# Patient Record
Sex: Male | Born: 1975 | Race: Black or African American | Hispanic: No | Marital: Single | Smoking: Former smoker
Health system: Southern US, Community
[De-identification: ages and names within clinical notes are randomized; demographics above are authoritative.]

## PROBLEM LIST (undated history)

## (undated) DIAGNOSIS — R569 Unspecified convulsions: Secondary | ICD-10-CM

## (undated) DIAGNOSIS — I10 Essential (primary) hypertension: Secondary | ICD-10-CM

## (undated) DIAGNOSIS — F1011 Alcohol abuse, in remission: Secondary | ICD-10-CM

## (undated) HISTORY — DX: Alcohol abuse, in remission: F10.11

## (undated) HISTORY — DX: Essential (primary) hypertension: I10

---

## 2004-08-07 ENCOUNTER — Emergency Department (HOSPITAL_COMMUNITY): Admission: EM | Admit: 2004-08-07 | Discharge: 2004-08-07 | Payer: Self-pay | Admitting: Emergency Medicine

## 2007-11-05 ENCOUNTER — Emergency Department (HOSPITAL_COMMUNITY): Admission: EM | Admit: 2007-11-05 | Discharge: 2007-11-05 | Payer: Self-pay | Admitting: Emergency Medicine

## 2011-01-22 ENCOUNTER — Emergency Department (HOSPITAL_COMMUNITY): Payer: Self-pay

## 2011-01-22 ENCOUNTER — Emergency Department (HOSPITAL_COMMUNITY)
Admission: EM | Admit: 2011-01-22 | Discharge: 2011-01-23 | Disposition: A | Discharge status: Home / Self Care | Payer: Self-pay | Attending: Emergency Medicine | Admitting: Emergency Medicine

## 2011-01-22 DIAGNOSIS — Z23 Encounter for immunization: Secondary | ICD-10-CM | POA: Insufficient documentation

## 2011-01-22 DIAGNOSIS — IMO0002 Reserved for concepts with insufficient information to code with codable children: Secondary | ICD-10-CM | POA: Insufficient documentation

## 2011-01-22 DIAGNOSIS — W1789XA Other fall from one level to another, initial encounter: Secondary | ICD-10-CM | POA: Insufficient documentation

## 2011-01-22 DIAGNOSIS — S81009A Unspecified open wound, unspecified knee, initial encounter: Secondary | ICD-10-CM | POA: Insufficient documentation

## 2011-01-22 DIAGNOSIS — S81809A Unspecified open wound, unspecified lower leg, initial encounter: Secondary | ICD-10-CM | POA: Insufficient documentation

## 2011-01-22 DIAGNOSIS — M79609 Pain in unspecified limb: Secondary | ICD-10-CM | POA: Insufficient documentation

## 2014-03-18 ENCOUNTER — Emergency Department (HOSPITAL_COMMUNITY)
Admission: EM | Admit: 2014-03-18 | Discharge: 2014-03-19 | Disposition: A | Discharge status: Home / Self Care | Payer: Self-pay | Attending: Emergency Medicine | Admitting: Emergency Medicine

## 2014-03-18 ENCOUNTER — Encounter (HOSPITAL_COMMUNITY): Payer: Self-pay | Admitting: Emergency Medicine

## 2014-03-18 ENCOUNTER — Emergency Department (HOSPITAL_COMMUNITY): Payer: Self-pay

## 2014-03-18 DIAGNOSIS — R404 Transient alteration of awareness: Secondary | ICD-10-CM | POA: Insufficient documentation

## 2014-03-18 DIAGNOSIS — R55 Syncope and collapse: Secondary | ICD-10-CM | POA: Insufficient documentation

## 2014-03-18 DIAGNOSIS — E860 Dehydration: Secondary | ICD-10-CM | POA: Insufficient documentation

## 2014-03-18 LAB — URINALYSIS, DIPSTICK ONLY
Bilirubin Urine: NEGATIVE
GLUCOSE, UA: NEGATIVE mg/dL
Ketones, ur: 40 mg/dL — AB
LEUKOCYTES UA: NEGATIVE
Nitrite: NEGATIVE
PH: 5.5 (ref 5.0–8.0)
PROTEIN: 30 mg/dL — AB
SPECIFIC GRAVITY, URINE: 1.016 (ref 1.005–1.030)
Urobilinogen, UA: 1 mg/dL (ref 0.0–1.0)

## 2014-03-18 LAB — CBC
HEMATOCRIT: 39 % (ref 39.0–52.0)
HEMOGLOBIN: 13.3 g/dL (ref 13.0–17.0)
MCH: 32.5 pg (ref 26.0–34.0)
MCHC: 34.1 g/dL (ref 30.0–36.0)
MCV: 95.4 fL (ref 78.0–100.0)
Platelets: 217 10*3/uL (ref 150–400)
RBC: 4.09 MIL/uL — AB (ref 4.22–5.81)
RDW: 14 % (ref 11.5–15.5)
WBC: 5.2 10*3/uL (ref 4.0–10.5)

## 2014-03-18 LAB — BASIC METABOLIC PANEL
Anion gap: 27 — ABNORMAL HIGH (ref 5–15)
BUN: 4 mg/dL — AB (ref 6–23)
CHLORIDE: 95 meq/L — AB (ref 96–112)
CO2: 17 meq/L — AB (ref 19–32)
Calcium: 9.3 mg/dL (ref 8.4–10.5)
Creatinine, Ser: 0.82 mg/dL (ref 0.50–1.35)
GFR calc Af Amer: >90 mL/min (ref >90)
GFR calc non Af Amer: >90 mL/min (ref >90)
GLUCOSE: 89 mg/dL (ref 70–99)
POTASSIUM: 4.8 meq/L (ref 3.7–5.3)
SODIUM: 139 meq/L (ref 137–147)

## 2014-03-18 LAB — RAPID URINE DRUG SCREEN, HOSP PERFORMED
AMPHETAMINES: NOT DETECTED
Barbiturates: NOT DETECTED
Benzodiazepines: NOT DETECTED
COCAINE: NOT DETECTED
OPIATES: NOT DETECTED
TETRAHYDROCANNABINOL: POSITIVE — AB

## 2014-03-18 LAB — I-STAT TROPONIN, ED: TROPONIN I, POC: 0 ng/mL (ref 0.00–0.08)

## 2014-03-18 MED ORDER — SODIUM CHLORIDE 0.9 % IV BOLUS (SEPSIS)
1000.0000 mL | Freq: Once | INTRAVENOUS | Status: AC
  Administered 2014-03-18: 1000 mL via INTRAVENOUS

## 2014-03-18 MED ORDER — ONDANSETRON HCL 4 MG/2ML IJ SOLN
4.0000 mg | Freq: Once | INTRAMUSCULAR | Status: DC
  Filled 2014-03-18: qty 2

## 2014-03-18 NOTE — ED Notes (Signed)
Pt in via EMS after a syncopal episode at home, pt was walking and family witnessed patient fall, pt does not remember event, pt was diaphoretic upon EMS arrival with HR in 130s, pt denies pain or complaints at this time, denies ETOH or drug use, pt CBG 104, denies medical history, pt remains diaphoretic at this time, HR 120 upon arrival.

## 2014-03-18 NOTE — Discharge Instructions (Signed)

## 2014-03-18 NOTE — ED Provider Notes (Signed)
CSN: [id number]     Arrival date & time 03/18/14  2034 History   First MD Initiated Contact with Patient 03/18/14 2036     Chief Complaint  Patient presents with  . Loss of Consciousness     (Consider location/radiation/quality/duration/timing/severity/associated sxs/prior Treatment) HPI  Michael Hayden is a 38 y.o. male with noncontributory past medical history, presenting for syncope. Per EMS report, patient's family supper patient get up from a chair at home earlier today, he started walking and "passed out." Patient denies anything prior to arrival, and is only complaining of mild generalized headache, but is unsure of preceding events. Patient states that he is home all day, and the house has been "hot" as the air conditioning was on. Patient endorses normal oral intake. Denies chest pain, shortness of breath, nausea, vomiting, diarrhea, abdominal pain, or other associated symptoms. Patient endorses smoking cigarettes, and occasional EtOH, but states that he did not consume EtOH today. Denies recreational drug use.    History reviewed. No pertinent past medical history. History reviewed. No pertinent past surgical history. History reviewed. No pertinent family history. History  Substance Use Topics  . Smoking status: Unknown If Ever Smoked  . Smokeless tobacco: Not on file  . Alcohol Use: Not on file    Review of Systems  Constitutional: Positive for diaphoresis.  HENT: Negative.   Eyes: Negative.   Respiratory: Negative.   Cardiovascular: Negative.   Gastrointestinal: Negative.   Endocrine: Negative.   Genitourinary: Negative.   Musculoskeletal: Negative.   Skin: Negative.   Allergic/Immunologic: Negative.   Neurological: Positive for syncope.  Hematological: Negative.   Psychiatric/Behavioral: Negative.       Allergies  Review of patient's allergies indicates no known allergies.  Home Medications   Prior to Admission medications   Not on File   BP 136/89   Pulse 114  Temp(Src) 99.3 F (37.4 C) (Oral)  Resp 18  SpO2 99% Physical Exam  Nursing note and vitals reviewed. Constitutional: He is oriented to person, place, and time. He appears well-developed and well-nourished. No distress.  HENT:  Head: Normocephalic and atraumatic.  Right Ear: External ear normal.  Left Ear: External ear normal.  Nose: Nose normal.  Mouth/Throat: Oropharynx is clear and moist. No oropharyngeal exudate.  Eyes: Conjunctivae and EOM are normal. Pupils are equal, round, and reactive to light. Right eye exhibits no discharge. Left eye exhibits no discharge. No scleral icterus.  Neck: Normal range of motion. Neck supple. No JVD present. No tracheal deviation present. No thyromegaly present.  Cardiovascular: Regular rhythm, normal heart sounds and intact distal pulses.  Tachycardia present.  Exam reveals no gallop and no friction rub.   No murmur heard. Pulmonary/Chest: Effort normal and breath sounds normal. No stridor. No respiratory distress. He has no wheezes. He has no rales. He exhibits no tenderness.  Abdominal: Soft. Bowel sounds are normal. He exhibits no distension. There is no tenderness. There is no rebound and no guarding.  Musculoskeletal: Normal range of motion. He exhibits no edema and no tenderness.  Lymphadenopathy:    He has no cervical adenopathy.  Neurological: He is alert and oriented to person, place, and time. He is not disoriented. He displays no atrophy and no tremor. No cranial nerve deficit or sensory deficit. He exhibits normal muscle tone. He displays no seizure activity. Coordination and gait normal. GCS eye subscore is 4. GCS verbal subscore is 5. GCS motor subscore is 6.  Pt unable to remember events prior to arrival before  his syncopal episode.  Otherwise, alert and oriented.  Skin: Skin is warm and dry. No rash noted. He is not diaphoretic. No erythema. No pallor.  Psychiatric: He has a normal mood and affect. His behavior is normal.  Judgment and thought content normal.    ED Course  Procedures (including critical care time) Labs Review Labs Reviewed  CBC - Abnormal; Notable for the following:    RBC 4.09 (*)    All other components within normal limits  BASIC METABOLIC PANEL - Abnormal; Notable for the following:    Chloride 95 (*)    CO2 17 (*)    BUN 4 (*)    Anion gap 27 (*)    All other components within normal limits  URINALYSIS, DIPSTICK ONLY - Abnormal; Notable for the following:    Hgb urine dipstick TRACE (*)    Ketones, ur 40 (*)    Protein, ur 30 (*)    All other components within normal limits  URINE RAPID DRUG SCREEN (HOSP PERFORMED) - Abnormal; Notable for the following:    Tetrahydrocannabinol POSITIVE (*)    All other components within normal limits  I-STAT TROPOININ, ED    Imaging Review Dg Chest 2 View  03/18/2014   CLINICAL DATA:  Loss of consciousness.  EXAM: CHEST  2 VIEW  COMPARISON:  No priors.  FINDINGS: Lung volumes are normal. No consolidative airspace disease. No pleural effusions. No pneumothorax. No pulmonary nodule or mass noted. Pulmonary vasculature and the cardiomediastinal silhouette are within normal limits.  IMPRESSION: No radiographic evidence of acute cardiopulmonary disease.   Electronically Signed   By: Trudie Reed M.D.   On: 03/18/2014 23:34     EKG Interpretation   Date/Time:  Sunday March 18 2014 20:40:37 EDT Ventricular Rate:  117 PR Interval:  124 QRS Duration: 74 QT Interval:  326 QTC Calculation: 455 R Axis:   70 Text Interpretation:  Sinus tachycardia Ventricular premature complex  Aberrant complex Right atrial enlargement LVH with secondary  repolarization abnormality no previous Confirmed by HARRISON  MD, FORREST  ([id number]) on 03/18/2014 9:11:40 PM      MDM   Final diagnoses:  Dehydration  Syncope, unspecified syncope type    On exam patient appears warm to touch, and is tachycardic and diaphoretic. Presentation possibly consistent with  heat associated syncope, possibly dehydration, or cardiac cause. Could also consider illicit drug use. We'll give a bolus of normal saline along with Zofran. We'll check orthostatic vital signs, EKG, chest x-ray, basic labs, and urine studies.  Patient's tachycardia improved with normal saline boluses. Workup notable for ketones in urine and positive anion gap likely secondary to ketosis. Later patient endorses alcohol consumption today, unclear how much. When asked to recount dense prior to arrival, he stated "my granddad was drinkin' too."  UDS positive for THC.  Patient states he feels better following administration of fluids. No findings on workup to suggest underlying cardiopulmonary pathology.  Patient  given return precautions for dehydration.  Advised to return for worsening symptoms including chest pain, shortness of breath, severe headache, intractable nausea or vomiting, fever, or chills, inability to take medications, or other acute concerns.  Advised to follow up with wellness clinic as needed.  Patient in agreement with and expressed understanding of follow plan, plan of care, and return precautions.  All questions answered prior to discharge.  Patient was discharged in stable condition, ambulating without difficulty.  Patient care was discussed with my attending, Dr. Romeo Apple.  Gavin Pound, MD 03/19/14  0105 

## 2014-03-20 NOTE — ED Provider Notes (Signed)
Medical screening examination/treatment/procedure(s) were conducted as a shared visit with resident physician and myself.  I personally evaluated the patient during the encounter.   EKG Interpretation  Date/Time:  Sunday March 18 2014 20:40:37 EDT Ventricular Rate:  117 PR Interval:  124 QRS Duration: 74 QT Interval:  326 QTC Calculation: 455 R Axis:   70 Text Interpretation:  Sinus tachycardia Ventricular premature complex Aberrant complex Right atrial enlargement LVH with secondary repolarization abnormality no previous Confirmed by Terina Mcelhinny  MD, Lima Chillemi ([id number]) on 03/18/2014 9:11:40 PM      I interviewed and examined the patient. Lungs are CTAB. Cardiac exam wnl. Abdomen soft.  Initially tachycardic. This resolved w/ IVF. Mild HA, but doubt any intracranial pathology as cause of syncope as pt otherwise well appearing and interactive w/ no gross motor/sensory deficits. He continues to appear well. Syncope likely multifactorial relating to etoh, heat, dehydration, possibly drug use etc. Will rec return for any worsening sx.   Purvis SheffieldForrest Caily Rakers, MD 03/20/14 1041

## 2014-05-19 ENCOUNTER — Emergency Department (HOSPITAL_COMMUNITY): Payer: Self-pay

## 2014-05-19 ENCOUNTER — Encounter (HOSPITAL_COMMUNITY): Payer: Self-pay | Admitting: Emergency Medicine

## 2014-05-19 ENCOUNTER — Emergency Department (HOSPITAL_COMMUNITY)
Admission: EM | Admit: 2014-05-19 | Discharge: 2014-05-19 | Disposition: A | Discharge status: Home / Self Care | Payer: Self-pay | Attending: Emergency Medicine | Admitting: Emergency Medicine

## 2014-05-19 DIAGNOSIS — H05233 Hemorrhage of bilateral orbit: Secondary | ICD-10-CM

## 2014-05-19 DIAGNOSIS — S01112A Laceration without foreign body of left eyelid and periocular area, initial encounter: Secondary | ICD-10-CM | POA: Insufficient documentation

## 2014-05-19 DIAGNOSIS — S2020XA Contusion of thorax, unspecified, initial encounter: Secondary | ICD-10-CM | POA: Insufficient documentation

## 2014-05-19 DIAGNOSIS — S0990XA Unspecified injury of head, initial encounter: Secondary | ICD-10-CM | POA: Insufficient documentation

## 2014-05-19 DIAGNOSIS — S0512XA Contusion of eyeball and orbital tissues, left eye, initial encounter: Secondary | ICD-10-CM | POA: Insufficient documentation

## 2014-05-19 DIAGNOSIS — Z720 Tobacco use: Secondary | ICD-10-CM | POA: Insufficient documentation

## 2014-05-19 DIAGNOSIS — S0511XA Contusion of eyeball and orbital tissues, right eye, initial encounter: Secondary | ICD-10-CM | POA: Insufficient documentation

## 2014-05-19 DIAGNOSIS — S0181XA Laceration without foreign body of other part of head, initial encounter: Secondary | ICD-10-CM

## 2014-05-19 DIAGNOSIS — K029 Dental caries, unspecified: Secondary | ICD-10-CM | POA: Insufficient documentation

## 2014-05-19 DIAGNOSIS — Z23 Encounter for immunization: Secondary | ICD-10-CM | POA: Insufficient documentation

## 2014-05-19 LAB — CBC
HEMATOCRIT: 37.7 % — AB (ref 39.0–52.0)
HEMOGLOBIN: 13 g/dL (ref 13.0–17.0)
MCH: 33 pg (ref 26.0–34.0)
MCHC: 34.5 g/dL (ref 30.0–36.0)
MCV: 95.7 fL (ref 78.0–100.0)
PLATELETS: 207 10*3/uL (ref 150–400)
RBC: 3.94 MIL/uL — ABNORMAL LOW (ref 4.22–5.81)
RDW: 15.3 % (ref 11.5–15.5)
WBC: 9.4 10*3/uL (ref 4.0–10.5)

## 2014-05-19 LAB — COMPREHENSIVE METABOLIC PANEL
ALBUMIN: 3.4 g/dL — AB (ref 3.5–5.2)
ALK PHOS: 56 U/L (ref 39–117)
ALT: 41 U/L (ref 0–53)
ANION GAP: 17 — AB (ref 5–15)
AST: 108 U/L — ABNORMAL HIGH (ref 0–37)
BILIRUBIN TOTAL: 0.3 mg/dL (ref 0.3–1.2)
BUN: 5 mg/dL — AB (ref 6–23)
CO2: 25 mEq/L (ref 19–32)
CREATININE: 0.8 mg/dL (ref 0.50–1.35)
Calcium: 8.1 mg/dL — ABNORMAL LOW (ref 8.4–10.5)
Chloride: 101 mEq/L (ref 96–112)
GFR calc non Af Amer: >90 mL/min (ref >90)
GLUCOSE: 149 mg/dL — AB (ref 70–99)
POTASSIUM: 3.3 meq/L — AB (ref 3.7–5.3)
Sodium: 143 mEq/L (ref 137–147)
TOTAL PROTEIN: 6.7 g/dL (ref 6.0–8.3)

## 2014-05-19 LAB — I-STAT CHEM 8, ED
CHLORIDE: 100 meq/L (ref 96–112)
Calcium, Ion: 1.05 mmol/L — ABNORMAL LOW (ref 1.12–1.23)
Creatinine, Ser: 1.3 mg/dL (ref 0.50–1.35)
Glucose, Bld: 149 mg/dL — ABNORMAL HIGH (ref 70–99)
HCT: 42 % (ref 39.0–52.0)
Hemoglobin: 14.3 g/dL (ref 13.0–17.0)
POTASSIUM: 3.1 meq/L — AB (ref 3.7–5.3)
SODIUM: 141 meq/L (ref 137–147)
TCO2: 25 mmol/L (ref 0–100)

## 2014-05-19 LAB — I-STAT CG4 LACTIC ACID, ED
LACTIC ACID, VENOUS: 4.33 mmol/L — AB (ref 0.5–2.2)
Lactic Acid, Venous: 2.6 mmol/L — ABNORMAL HIGH (ref 0.5–2.2)

## 2014-05-19 LAB — PROTIME-INR
INR: 1.01 (ref 0.00–1.49)
Prothrombin Time: 13.3 seconds (ref 11.6–15.2)

## 2014-05-19 LAB — ETHANOL: Alcohol, Ethyl (B): 299 mg/dL — ABNORMAL HIGH (ref 0–11)

## 2014-05-19 MED ORDER — OXYCODONE-ACETAMINOPHEN 5-325 MG PO TABS: 2.0000 | ORAL_TABLET | ORAL | Status: DC | PRN

## 2014-05-19 MED ORDER — LIDOCAINE-EPINEPHRINE (PF) 2 %-1:200000 IJ SOLN
20.0000 mL | Freq: Once | INTRAMUSCULAR | Status: DC
  Filled 2014-05-19: qty 20

## 2014-05-19 MED ORDER — SODIUM CHLORIDE 0.9 % IV BOLUS (SEPSIS)
1000.0000 mL | Freq: Once | INTRAVENOUS | Status: AC
  Administered 2014-05-19: 1000 mL via INTRAVENOUS

## 2014-05-19 MED ORDER — HYDROMORPHONE HCL 1 MG/ML IJ SOLN
1.0000 mg | Freq: Once | INTRAMUSCULAR | Status: AC
  Administered 2014-05-19: 1 mg via INTRAVENOUS
  Filled 2014-05-19: qty 1

## 2014-05-19 MED ORDER — TETANUS-DIPHTH-ACELL PERTUSSIS 5-2.5-18.5 LF-MCG/0.5 IM SUSP
0.5000 mL | Freq: Once | INTRAMUSCULAR | Status: AC
  Administered 2014-05-19: 0.5 mL via INTRAMUSCULAR
  Filled 2014-05-19: qty 0.5

## 2014-05-19 NOTE — ED Notes (Signed)
Pt placed in a gown. Pt hooked up to cardiac monitor with blood pressure cuff and pulse ox in place

## 2014-05-19 NOTE — ED Notes (Signed)
PA at bedside suturing pt. 
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
## 2014-05-19 NOTE — ED Notes (Signed)
Patient transported to CT 
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
## 2014-05-19 NOTE — ED Notes (Signed)
Lactic acid results given to Dr. Pickering 

## 2014-05-19 NOTE — ED Provider Notes (Signed)
  Physical Exam  BP 129/81  Pulse 101  Temp(Src) 98.4 F (36.9 C) (Axillary)  Resp 16  SpO2 94%  Physical Exam  Nursing note and vitals reviewed. Constitutional: He is oriented to person, place, and time. He appears well-developed and well-nourished. No distress.  HENT:  Head: Normocephalic and atraumatic.    6-7 cm jagged laceration with mild hemorrhaging.  Eyes: Right eye exhibits no discharge. Left eye exhibits no discharge. No scleral icterus.  Neck: Normal range of motion.  Pulmonary/Chest: Effort normal. No respiratory distress.  Musculoskeletal: Normal range of motion.  Neurological: He is alert and oriented to person, place, and time.  Skin: Skin is warm and dry. He is not diaphoretic.  Psychiatric: He has a normal mood and affect.    ED Course  LACERATION REPAIR Date/Time: 05/19/2014 7:29 AM Performed by: Monte FantasiaMINTZ, JOSEPH W Authorized by: Monte FantasiaMINTZ, JOSEPH W Consent: Verbal consent obtained. Risks and benefits: risks, benefits and alternatives were discussed Consent given by: patient Patient identity confirmed: verbally with patient Body area: head/neck Location details: left eyelid Laceration length: 7 cm Foreign bodies: no foreign bodies Tendon involvement: none Nerve involvement: none Vascular damage: no Anesthesia: local infiltration Local anesthetic: lidocaine 2% with epinephrine Anesthetic total: 7 ml Patient sedated: no Preparation: Patient was prepped and draped in the usual sterile fashion. Irrigation solution: saline Irrigation method: syringe Amount of cleaning: standard Debridement: none Degree of undermining: none Skin closure: 5-0 Prolene Number of sutures: 14 Technique: simple Approximation: close Approximation difficulty: complex Dressing: 4x4 sterile gauze and antibiotic ointment Patient tolerance: Patient tolerated the procedure well with no immediate complications.    MDM Patient is a being seen and evaluated by Dr. Norlene Campbelltter. I was  requested to perform skin laceration to patient's superficial inferior left eyelid. Laceration is approximately 6-7 cm, with stellate flap approximately 1 to 2 cm from the medial epicanthal fold. 14 sutures placed, as noted in procedure note above.  Signed,  Ladona MowJoe Asees Manfredi, PA-C 7:37 AM       Monte FantasiaJoseph W Mahagony Grieb, PA-C 05/19/14 [phone]

## 2014-05-20 NOTE — ED Provider Notes (Signed)
Medical screening examination/treatment/procedure(s) were conducted as a shared visit with non-physician practitioner(s) and myself.  I personally evaluated the patient during the encounter.   EKG Interpretation   Date/Time:  Saturday May 19 2014 01:33:56 EDT Ventricular Rate:  90 PR Interval:  133 QRS Duration: 78 QT Interval:  389 QTC Calculation: 476 R Axis:   65 Text Interpretation:  Sinus rhythm Consider left ventricular hypertrophy  Abnrm T, consider ischemia, anterolateral lds diffuse t wave changed from  prior Confirmed by Maisha Bogen  MD, Balen Woolum ([id number]) on 05/19/2014 2:50:36 AM     Pt seen by me, please see note.  Lac repair done by PA Sula SodaMintz  Daylee Delahoz M Aniela Caniglia, MD 05/20/14 [phone]

## 2014-05-23 ENCOUNTER — Encounter (HOSPITAL_COMMUNITY): Payer: Self-pay | Admitting: Emergency Medicine

## 2014-05-23 ENCOUNTER — Emergency Department (INDEPENDENT_AMBULATORY_CARE_PROVIDER_SITE_OTHER)
Admission: EM | Admit: 2014-05-23 | Discharge: 2014-05-23 | Disposition: A | Discharge status: Home / Self Care | Payer: Self-pay | Source: Home / Self Care | Attending: Family Medicine | Admitting: Family Medicine

## 2014-05-23 DIAGNOSIS — Z4802 Encounter for removal of sutures: Secondary | ICD-10-CM
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
## 2014-05-23 NOTE — Discharge Instructions (Signed)
Suture Removal, Care After °Refer to this sheet in the next few weeks. These instructions provide you with information on caring for yourself after your procedure. Your health care provider may also give you more specific instructions. Your treatment has been planned according to current medical practices, but problems sometimes occur. Call your health care provider if you have any problems or questions after your procedure. °WHAT TO EXPECT AFTER THE PROCEDURE °After your stitches (sutures) are removed, it is typical to have the following: °· Some discomfort and swelling in the wound area. °· Slight redness in the area. °HOME CARE INSTRUCTIONS  °· If you have skin adhesive strips over the wound area, do not take the strips off. They will fall off on their own in a few days. If the strips remain in place after 14 days, you may remove them. °· Change any bandages (dressings) at least once a day or as directed by your health care provider. If the bandage sticks, soak it off with warm, soapy water. °· Apply cream or ointment only as directed by your health care provider. If using cream or ointment, wash the area with soap and water 2 times a day to remove all the cream or ointment. Rinse off the soap and pat the area dry with a clean towel. °· Keep the wound area dry and clean. If the bandage becomes wet or dirty, or if it develops a bad smell, change it as soon as possible. °· Continue to protect the wound from injury. °· Use sunscreen when out in the sun. New scars become sunburned easily. °SEEK MEDICAL CARE IF: °· You have increasing redness, swelling, or pain in the wound. °· You see pus coming from the wound. °· You have a fever. °· You notice a bad smell coming from the wound or dressing. °· Your wound breaks open (edges not staying together). °Document Released: 04/21/2001 Document Revised: 05/17/2013 Document Reviewed: 03/08/2013 °ExitCare® Patient Information ©2015 [employer]. This information is not  intended to replace advice given to you by your health care provider. Make sure you discuss any questions you have with your health care provider. ° °Scar Minimization °You will have a scar anytime you have surgery and a cut is made in the skin or you have something removed from your skin (mole, skin cancer, cyst). Although scars are unavoidable following surgery, there are ways to minimize their appearance. °It is important to follow all the instructions you receive from your caregiver about wound care. How your wound heals will influence the appearance of your scar. If you do not follow the wound care instructions as directed, complications such as infection may occur. Wound instructions include keeping the wound clean, moist, and not letting the wound form a scab. Some people form scars that are raised and lumpy (hypertrophic) or larger than the initial wound (keloidal). °HOME CARE INSTRUCTIONS  °· Follow wound care instructions as directed. °· Keep the wound clean by washing it with soap and water. °· Keep the wound moist with provided antibiotic cream or petroleum jelly until completely healed. Moisten twice a day for about 2 weeks. °· Get stitches (sutures) taken out at the scheduled time. °· Avoid touching or manipulating your wound unless needed. Wash your hands thoroughly before and after touching your wound. °· Follow all restrictions such as limits on exercise or work. This depends on where your scar is located. °· Keep the scar protected from sunburn. Cover the scar with sunscreen/sunblock with SPF 30 or higher. °·   Gently massage the scar using a circular motion to help minimize the appearance of the scar. Do this only after the wound has closed and all the sutures have been removed. °· For hypertrophic or keloidal scars, there are several ways to treat and minimize their appearance. Methods include compression therapy, intralesional corticosteroids, laser therapy, or surgery. These methods are performed  by your caregiver. °Remember that the scar may appear lighter or darker than your normal skin color. This difference in color should even out with time. °SEEK MEDICAL CARE IF:  °· You have a fever. °· You develop signs of infection such as pain, redness, pus, and warmth. °· You have questions or concerns. °Document Released: 01/14/2010 Document Revised: 10/19/2011 Document Reviewed: 01/14/2010 °ExitCare® Patient Information ©2015 [employer]. This information is not intended to replace advice given to you by your health care provider. Make sure you discuss any questions you have with your health care provider. ° °

## 2014-05-25 NOTE — ED Provider Notes (Signed)
Medical screening examination/treatment/procedure(s) were performed by non-physician practitioner and as supervising physician I was immediately available for consultation/collaboration.  Indiya Izquierdo, M.D.  Onedia Vargus C Maki Sweetser, MD 05/25/14 1757 

## 2015-03-11 ENCOUNTER — Encounter (HOSPITAL_COMMUNITY): Payer: Self-pay | Admitting: Nurse Practitioner

## 2015-03-11 ENCOUNTER — Emergency Department (HOSPITAL_COMMUNITY): Payer: Self-pay

## 2015-03-11 ENCOUNTER — Emergency Department (HOSPITAL_COMMUNITY)
Admission: EM | Admit: 2015-03-11 | Discharge: 2015-03-11 | Disposition: A | Discharge status: Home / Self Care | Payer: Self-pay | Attending: Emergency Medicine | Admitting: Emergency Medicine

## 2015-03-11 DIAGNOSIS — Z720 Tobacco use: Secondary | ICD-10-CM | POA: Insufficient documentation

## 2015-03-11 DIAGNOSIS — R569 Unspecified convulsions: Secondary | ICD-10-CM | POA: Insufficient documentation

## 2015-03-11 LAB — BASIC METABOLIC PANEL
Anion gap: 13 (ref 5–15)
CHLORIDE: 103 mmol/L (ref 101–111)
CO2: 21 mmol/L — AB (ref 22–32)
CREATININE: 1.03 mg/dL (ref 0.61–1.24)
Calcium: 9.8 mg/dL (ref 8.9–10.3)
GFR calc Af Amer: >60 mL/min (ref >60)
GFR calc non Af Amer: >60 mL/min (ref >60)
Glucose, Bld: 111 mg/dL — ABNORMAL HIGH (ref 65–99)
POTASSIUM: 4.7 mmol/L (ref 3.5–5.1)
Sodium: 137 mmol/L (ref 135–145)

## 2015-03-11 LAB — URINE MICROSCOPIC-ADD ON

## 2015-03-11 LAB — CBG MONITORING, ED: Glucose-Capillary: 107 mg/dL — ABNORMAL HIGH (ref 65–99)

## 2015-03-11 LAB — CBC
HCT: 41.7 % (ref 39.0–52.0)
HEMOGLOBIN: 14.3 g/dL (ref 13.0–17.0)
MCH: 32 pg (ref 26.0–34.0)
MCHC: 34.3 g/dL (ref 30.0–36.0)
MCV: 93.3 fL (ref 78.0–100.0)
PLATELETS: 250 10*3/uL (ref 150–400)
RBC: 4.47 MIL/uL (ref 4.22–5.81)
RDW: 13.8 % (ref 11.5–15.5)
WBC: 4.5 10*3/uL (ref 4.0–10.5)

## 2015-03-11 LAB — URINALYSIS, ROUTINE W REFLEX MICROSCOPIC
Bilirubin Urine: NEGATIVE
Glucose, UA: NEGATIVE mg/dL
Ketones, ur: 15 mg/dL — AB
LEUKOCYTES UA: NEGATIVE
Nitrite: NEGATIVE
PROTEIN: 30 mg/dL — AB
SPECIFIC GRAVITY, URINE: 1.02 (ref 1.005–1.030)
Urobilinogen, UA: 0.2 mg/dL (ref 0.0–1.0)
pH: 5.5 (ref 5.0–8.0)

## 2015-03-11 LAB — RAPID URINE DRUG SCREEN, HOSP PERFORMED
Amphetamines: NOT DETECTED
BENZODIAZEPINES: NOT DETECTED
Barbiturates: NOT DETECTED
Cocaine: NOT DETECTED
OPIATES: NOT DETECTED
TETRAHYDROCANNABINOL: NOT DETECTED

## 2015-03-11 LAB — ETHANOL: Alcohol, Ethyl (B): <5 mg/dL (ref <5)

## 2015-03-11 NOTE — ED Notes (Signed)
Family states they were sitting on the couch watching tv when the pt began to yell and shake all over uncontrollably. The pt does not remember the event, but states that he thought he was just taking a nap at the time. He is A&Ox4, no injuries or incontinence noted, denies hx seizures. He does c/o mild headache now.

## 2015-03-11 NOTE — ED Provider Notes (Signed)
I received this patient in sign out from Dr. Judd Lien. 39 year old male with history of alcohol abuse presented with first-time seizure earlier today. Workup reassuring and head CT negative for acute process to explain the patient's symptoms. On reexamination, the patient remains well-appearing with normal vital signs and no shakiness or other evidence of alcohol withdrawal. Per neurology recommendations, the patient has been provided with information for neurology follow-up to have outpatient EEG. Reviewed follow-up plan and return precautions with the patient and his family member. They voiced understanding. All questions answered. Patient discharged in satisfactory condition.  Results for orders placed or performed during the hospital encounter of 03/11/15  Basic metabolic panel - if new onset seizures  Result Value Ref Range   Sodium 137 135 - 145 mmol/L   Potassium 4.7 3.5 - 5.1 mmol/L   Chloride 103 101 - 111 mmol/L   CO2 21 (L) 22 - 32 mmol/L   Glucose, Bld 111 (H) 65 - 99 mg/dL   BUN <5 (L) 6 - 20 mg/dL   Creatinine, Ser 1.19 0.61 - 1.24 mg/dL   Calcium 9.8 8.9 - 14.7 mg/dL   GFR calc non Af Amer >60 >60 mL/min   GFR calc Af Amer >60 >60 mL/min   Anion gap 13 5 - 15  CBC - if new onset seizures  Result Value Ref Range   WBC 4.5 4.0 - 10.5 K/uL   RBC 4.47 4.22 - 5.81 MIL/uL   Hemoglobin 14.3 13.0 - 17.0 g/dL   HCT 82.9 56.2 - 13.0 %   MCV 93.3 78.0 - 100.0 fL   MCH 32.0 26.0 - 34.0 pg   MCHC 34.3 30.0 - 36.0 g/dL   RDW 86.5 78.4 - 69.6 %   Platelets 250 150 - 400 K/uL  Urinalysis, Routine w reflex microscopic (not at [facility])  Result Value Ref Range   Color, Urine YELLOW YELLOW   APPearance TURBID (A) CLEAR   Specific Gravity, Urine 1.020 1.005 - 1.030   pH 5.5 5.0 - 8.0   Glucose, UA NEGATIVE NEGATIVE mg/dL   Hgb urine dipstick TRACE (A) NEGATIVE   Bilirubin Urine NEGATIVE NEGATIVE   Ketones, ur 15 (A) NEGATIVE mg/dL   Protein, ur 30 (A) NEGATIVE mg/dL   Urobilinogen, UA 0.2  0.0 - 1.0 mg/dL   Nitrite NEGATIVE NEGATIVE   Leukocytes, UA NEGATIVE NEGATIVE  Ethanol  Result Value Ref Range   Alcohol, Ethyl (B) <5 <5 mg/dL  Urine microscopic-add on  Result Value Ref Range   RBC / HPF 0-2 <3 RBC/hpf   Bacteria, UA FEW (A) RARE  CBG monitoring, ED  Result Value Ref Range   Glucose-Capillary 107 (H) 65 - 99 mg/dL   Ct Head Wo Contrast  03/11/2015   CLINICAL DATA:  Altered mental status, mild headache  EXAM: CT HEAD WITHOUT CONTRAST  TECHNIQUE: Contiguous axial images were obtained from the base of the skull through the vertex without intravenous contrast.  COMPARISON:  05/19/2014  FINDINGS: Mild diffuse atrophy somewhat disproportionate for patient age. No focal abnormality to suggest mass or infarct. No hemorrhage or extra-axial fluid. No hydrocephalus. Calvarium intact. Mild chronic appearing inflammatory change in the ethmoid air cells bilaterally.  IMPRESSION: Atrophy, mild but nonetheless somewhat advanced for patient age. No acute findings.   Electronically Signed   By: Esperanza Heir M.D.   On: 03/11/2015 16:51     Laurence Spates, MD 03/11/15 [phone]

## 2015-03-11 NOTE — ED Provider Notes (Signed)
CSN: [id number]     Arrival date & time 03/11/15  1318 History   First MD Initiated Contact with Patient 03/11/15 1401     Chief Complaint  Patient presents with  . Shaking     (Consider location/radiation/quality/duration/timing/severity/associated sxs/prior Treatment) HPI Comments: Patient is a 39 year old male with no prior past medical history. He presents today for evaluation of possible seizure activity. He was sitting on the sofa earlier today when his mother reports he yelled, then broke out into a grand mal seizure. This lasted for approximately 5 minutes was brought here to be evaluated. He was initially confused afterward, however is now back to his baseline. He reported a headache after the episode, however this has also since resolved. He has no prior history of seizures. He does admit to drinking alcohol, up to 2 beers daily and has done so for many years.  Patient is a 39 y.o. male presenting with seizures. The history is provided by the patient.  Seizures Seizure activity on arrival: no   Seizure type:  Grand mal Initial focality:  Diffuse Postictal symptoms: confusion   Return to baseline: yes   Severity:  Moderate Duration:  5 minutes Timing:  Once Progression:  Resolved   History reviewed. No pertinent past medical history. History reviewed. No pertinent past surgical history. History reviewed. No pertinent family history. History  Substance Use Topics  . Smoking status: Current Every Day Smoker -- 0.25 packs/day  . Smokeless tobacco: Not on file  . Alcohol Use: 1.8 oz/week    3 Cans of beer per week    Review of Systems  Neurological: Positive for seizures.  All other systems reviewed and are negative.     Allergies  Review of patient's allergies indicates no known allergies.  Home Medications   Prior to Admission medications   Medication Sig Start Date End Date Taking? Authorizing Provider  oxyCODONE-acetaminophen (PERCOCET/ROXICET) 5-325 MG per  tablet Take 2 tablets by mouth every 4 (four) hours as needed for severe pain. 05/19/14   Marisa Severin, MD   BP 123/90 mmHg  Pulse 107  Temp(Src) 98.5 F (36.9 C) (Oral)  Resp 16  Ht 5\' 8"  (1.727 m)  Wt 150 lb (68.04 kg)  BMI 22.81 kg/m2  SpO2 97% Physical Exam  Constitutional: He is oriented to person, place, and time. He appears well-developed and well-nourished. No distress.  HENT:  Head: Normocephalic and atraumatic.  Mouth/Throat: Oropharynx is clear and moist.  Eyes: EOM are normal. Pupils are equal, round, and reactive to light.  Neck: Normal range of motion. Neck supple.  Cardiovascular: Normal rate, regular rhythm and normal heart sounds.   Pulmonary/Chest: Effort normal and breath sounds normal. No respiratory distress. He has no wheezes.  Abdominal: Soft. Bowel sounds are normal. He exhibits no distension. There is no tenderness.  Musculoskeletal: Normal range of motion. He exhibits no edema.  Lymphadenopathy:    He has no cervical adenopathy.  Neurological: He is alert and oriented to person, place, and time. No cranial nerve deficit. He exhibits normal muscle tone. Coordination normal.  Skin: Skin is warm and dry. He is not diaphoretic.  Nursing note and vitals reviewed.   ED Course  Procedures (including critical care time) Labs Review Labs Reviewed  BASIC METABOLIC PANEL - Abnormal; Notable for the following:    CO2 21 (*)    Glucose, Bld 111 (*)    BUN <5 (*)    All other components within normal limits  CBG MONITORING, ED -  Abnormal; Notable for the following:    Glucose-Capillary 107 (*)    All other components within normal limits  CBC  URINALYSIS, ROUTINE W REFLEX MICROSCOPIC (NOT AT [facility])  URINE RAPID DRUG SCREEN, HOSP PERFORMED    Imaging Review No results found.   EKG Interpretation None      MDM   Final diagnoses:  None    Patient presents after an apparent seizure area his laboratory studies are unremarkable and will now undergo a CT  scan of the head to rule out acute intracranial pathology. This is pending. Care will be signed out to Dr. Clarene Duke at shift change she will determine the final disposition based on the results of the head CT.  I've discussed the case with neurology who is recommending outpatient follow-up for EEG.  I also feel as though the patient likely drinks more alcohol than he admits to and this may also be the cause of this apparent seizure.    Geoffery Lyons, MD 03/14/15 [phone]

## 2015-03-11 NOTE — Discharge Instructions (Signed)
Follow-up with [facility] neurology. The contact information has been provided in this discharge summary for you to arrange an appointment in the next few days.  Strong caution against swimming or driving until cleared by neurology.   Seizure, Adult A seizure is abnormal electrical activity in the brain. Seizures usually last from 30 seconds to 2 minutes. There are various types of seizures. Before a seizure, you may have a warning sensation (aura) that a seizure is about to occur. An aura may include the following symptoms:   Fear or anxiety.  Nausea.  Feeling like the room is spinning (vertigo).  Vision changes, such as seeing flashing lights or spots. Common symptoms during a seizure include:  A change in attention or behavior (altered mental status).  Convulsions with rhythmic jerking movements.  Drooling.  Rapid eye movements.  Grunting.  Loss of bladder and bowel control.  Bitter taste in the mouth.  Tongue biting. After a seizure, you may feel confused and sleepy. You may also have an injury resulting from convulsions during the seizure. HOME CARE INSTRUCTIONS   If you are given medicines, take them exactly as prescribed by your health care provider.  Keep all follow-up appointments as directed by your health care provider.  Do not swim or drive or engage in risky activity during which a seizure could cause further injury to you or others until your health care provider says it is OK.  Get adequate rest.  Teach friends and family what to do if you have a seizure. They should:  Lay you on the ground to prevent a fall.  Put a cushion under your head.  Loosen any tight clothing around your neck.  Turn you on your side. If vomiting occurs, this helps keep your airway clear.  Stay with you until you recover.  Know whether or not you need emergency care. SEEK IMMEDIATE MEDICAL CARE IF:  The seizure lasts longer than 5 minutes.  The seizure is severe or you  do not wake up immediately after the seizure.  You have an altered mental status after the seizure.  You are having more frequent or worsening seizures. Someone should drive you to the emergency department or call local emergency services (911 in [place] MAKE SURE YOU:  Understand these instructions.  Will watch your condition.  Will get help right away if you are not doing well or get worse. Document Released: 07/24/2000 Document Revised: 05/17/2013 Document Reviewed: 03/08/2013 [facility] Patient Information 2015 [place], [place]. This information is not intended to replace advice given to you by your health care provider. Make sure you discuss any questions you have with your health care provider.

## 2015-03-11 NOTE — ED Notes (Signed)
Patient transported to CT 

## 2016-12-25 DIAGNOSIS — F1010 Alcohol abuse, uncomplicated: Secondary | ICD-10-CM | POA: Insufficient documentation

## 2016-12-25 DIAGNOSIS — R569 Unspecified convulsions: Secondary | ICD-10-CM | POA: Insufficient documentation

## 2017-01-27 ENCOUNTER — Ambulatory Visit (INDEPENDENT_AMBULATORY_CARE_PROVIDER_SITE_OTHER): Payer: Self-pay | Admitting: Physician Assistant

## 2017-01-27 ENCOUNTER — Encounter (INDEPENDENT_AMBULATORY_CARE_PROVIDER_SITE_OTHER): Payer: Self-pay | Admitting: Physician Assistant

## 2017-01-27 VITALS — BP 128/87 | HR 81 | Temp 97.9°F | Ht 69.5 in | Wt 140.4 lb

## 2017-01-27 DIAGNOSIS — M67432 Ganglion, left wrist: Secondary | ICD-10-CM | POA: Insufficient documentation

## 2017-01-27 DIAGNOSIS — Z8669 Personal history of other diseases of the nervous system and sense organs: Secondary | ICD-10-CM | POA: Insufficient documentation

## 2017-01-27 MED ORDER — LEVETIRACETAM 500 MG PO TABS: 500.0000 mg | ORAL_TABLET | Freq: Two times a day (BID) | ORAL | 3 refills | Status: DC

## 2017-01-27 MED FILL — ?LEVETIRACETAM 500 MG TABLE: 500 | 15 days supply | Qty: 30 | Fill #0

## 2017-01-27 NOTE — Progress Notes (Signed)
  Subjective:  Patient ID: Michael Hayden, male    DOB: 1976/03/02  Age: 41 y.o. MRN: [id number]  CC:   HPI Michael LimboWillie M Hayden is a 41 y.o. male with a recent history of seizures presents to establish care for seizures. Was at [facility] ED on 12/25/16 for a seizure. Reportedly had a CT scan done which was uremarkable. Had two seizures since his emergency room visit on 12/25/16. Has not been to neurology due to financial constraints. Is not taking any anti-epileptics. Has no other complaints or symptoms. Continues to drink beer regularly but states he has cut down. Reportedly drinking two beers daily or less.      ROS Review of Systems  Constitutional: Negative for chills, fever and malaise/fatigue.  Eyes: Negative for blurred vision.  Respiratory: Negative for shortness of breath.   Cardiovascular: Negative for chest pain and palpitations.  Gastrointestinal: Negative for abdominal pain and nausea.  Genitourinary: Negative for dysuria and hematuria.  Musculoskeletal: Negative for joint pain and myalgias.       Lump on left wrist  Skin: Negative for rash.  Neurological: Negative for tingling and headaches.  Psychiatric/Behavioral: Negative for depression. The patient is not nervous/anxious.     Objective:  BP 128/87 (BP Location: Right Arm, Patient Position: Sitting, Cuff Size: Normal)   Pulse 81   Temp 97.9 F (36.6 C) (Oral)   Ht 5' 9.5" (1.765 m)   Wt 140 lb 6.4 oz (63.7 kg)   SpO2 98%   BMI 20.44 kg/m   BP/Weight 01/27/2017 03/11/2015 05/23/2014  Systolic BP 128 125 150  Diastolic BP 87 70 101  Wt. (Lbs) 140.4 150 150  BMI 20.44 22.81 22.81      Physical Exam  Constitutional: He is oriented to person, place, and time.  Thin, NAD  HENT:  Head: Normocephalic and atraumatic.  Eyes: No scleral icterus.  Neck: Normal range of motion. Neck supple. No thyromegaly present.  Cardiovascular: Normal rate, regular rhythm and normal heart sounds.   No carotid bruits   Pulmonary/Chest: Effort normal and breath sounds normal.  Musculoskeletal: He exhibits no edema.  Neurological: He is alert and oriented to person, place, and time. No cranial nerve deficit.  Skin: Skin is warm and dry. No rash noted. No erythema. No pallor.  Approximately 1.5 cm subdermal nodule that is freely movable and rubbery to the touch located on the dorsum of the left wrist.  Psychiatric: He has a normal mood and affect. His behavior is normal. Thought content normal.  Vitals reviewed.    Assessment & Plan:   1. Hx of tonic-clonic seizures - Begin Keppra 500mg  BID - Patient wants to wait on [employer] [facility] and [facility] before being referred to neurology.    2. Ganglion cyst on dorsum of left wrist - Would need referral to surgery but patient will wait until he has acquired [employer] card and [facility].    Follow-up: 4 month for full physical  Loletta Specteroger David Vincenza Dail PA

## 2017-01-27 NOTE — Patient Instructions (Signed)
Epilepsy °Epilepsy is a condition in which a person has repeated seizures over time. A seizure is a sudden burst of abnormal electrical and chemical activity in the brain. Seizures can cause a change in attention, behavior, or the ability to remain awake and alert (altered mental status). °Epilepsy increases a person's risk of falls, accidents, and injury. It can also lead to complications, including: °· Depression. °· Poor memory. °· Sudden unexplained death in epilepsy (SUDEP). This complication is rare, and its cause is not known. ° °Most people with epilepsy lead normal lives. °What are the causes? °This condition may be caused by: °· A head injury. °· An injury that happens at birth. °· A high fever during childhood. °· A stroke. °· Bleeding that goes into or around the brain. °· Certain medicines and drugs. °· Having too little oxygen for a long period of time. °· Abnormal brain development. °· Certain infections, such as meningitis and encephalitis. °· Brain tumors. °· Conditions that are passed along from parent to child (are hereditary). ° °What are the signs or symptoms? °Symptoms of a seizure vary greatly from person to person. They include: °· Convulsions. °· Stiffening of the body. °· Involuntary movements of the arms or legs. °· Loss of consciousness. °· Breathing problems. °· Falling suddenly. °· Confusion. °· Head nodding. °· Eye blinking or fluttering. °· Lip smacking. °· Drooling. °· Rapid eye movements. °· Grunting. °· Loss of bladder control and bowel control. °· Staring. °· Unresponsiveness. ° °Some people have symptoms right before a seizure happens (aura) and right after a seizure happens. Symptoms of an aura include: °· Fear or anxiety. °· Nausea. °· Feeling like the room is spinning (vertigo). °· A feeling of having seen or heard something before (deja vu). °· Odd tastes or smells. °· Changes in vision, such as seeing flashing lights or spots. ° °Symptoms that follow a seizure  include: °· Confusion. °· Sleepiness. °· Headache. ° °How is this diagnosed? °This condition is diagnosed based on: °· Your symptoms. °· Your medical history. °· A physical exam. °· A neurological exam. A neurological exam is similar to a physical exam. It involves checking your strength, reflexes, coordination, and sensations. °· Tests, such as: °? An electroencephalogram (EEG). This is a painless test that creates a diagram of your brain waves. °? An MRI of the brain. °? A CT scan of the brain. °? A lumbar puncture, also called a spinal tap. °? Blood tests to check for signs of infection or abnormal blood chemistry. ° °How is this treated? °There is no cure for this condition, but treatment can help control seizures. Treatment may involve: °· Taking medicines to control seizures. These include medicines to prevent seizures and medicines to stop seizures as they occur. °· Having a device called a vagus nerve stimulator implanted in the chest. The device sends electrical impulses to the vagus nerve and to the brain to prevent seizures. This treatment may be recommended if medicines do not help. °· Brain surgery. There are several kinds of surgeries that may be done to stop seizures from happening or to reduce how often seizures happen. °· Having regular blood tests. You may need to have blood tests regularly to check that you are getting the right amount of medicine. ° °Once this condition has been diagnosed, it is important to begin treatment as soon as possible. For some people, epilepsy eventually goes away. °Follow these instructions at home: °Medicines ° °· Take over-the-counter and prescription medicines only as   told by your health care provider. °· Avoid any substances that may prevent your medicine from working properly, such as alcohol. °Activity °· Get enough rest. Lack of sleep can make seizures more likely to occur. °· Follow instructions from your health care provider about driving, swimming, and doing  any other activities that would be dangerous if you had a seizure. °Educating others °Teach friends and family what to do if you have a seizure. They should: °· Lay you on the ground to prevent a fall. °· Cushion your head and body. °· Loosen any tight clothing around your neck. °· Turn you on your side. If vomiting occurs, this helps keep your airway clear. °· Stay with you until you recover. °· Not hold you down. Holding you down will not stop the seizure. °· Not put anything in your mouth. °· Know whether or not you need emergency care. ° °General instructions °· Avoid anything that has ever triggered a seizure for you. °· Keep a seizure diary. Record what you remember about each seizure, especially anything that might have triggered the seizure. °· Keep all follow-up visits as told by your health care provider. This is important. °Contact a health care provider if: °· Your seizure pattern changes. °· You have symptoms of infection or another illness. This might increase your risk of having a seizure. °Get help right away if: °· You have a seizure that does not stop after 5 minutes. °· You have several seizures in a row without a complete recovery in between seizures. °· You have a seizure that makes it harder to breathe. °· You have a seizure that is different from previous seizures. °· You have a seizure that leaves you unable to speak or use a part of your body. °· You did not wake up immediately after a seizure. °This information is not intended to replace advice given to you by your health care provider. Make sure you discuss any questions you have with your health care provider. °Document Released: 07/27/2005 Document Revised: 02/22/2016 Document Reviewed: 02/04/2016 °Elsevier Interactive Patient Education © 2018 Elsevier Inc. ° °

## 2017-01-29 LAB — COMPREHENSIVE METABOLIC PANEL
ALBUMIN: 4.4 g/dL (ref 3.5–5.5)
ALK PHOS: 74 IU/L (ref 39–117)
ALT: 85 IU/L — ABNORMAL HIGH (ref 0–44)
AST: 194 IU/L — ABNORMAL HIGH (ref 0–40)
Albumin/Globulin Ratio: 1.6 (ref 1.2–2.2)
BUN / CREAT RATIO: 7 — AB (ref 9–20)
BUN: 6 mg/dL (ref 6–20)
Bilirubin Total: 0.5 mg/dL (ref 0.0–1.2)
CHLORIDE: 98 mmol/L (ref 96–106)
CO2: 23 mmol/L (ref 20–29)
Calcium: 9.2 mg/dL (ref 8.7–10.2)
Creatinine, Ser: 0.86 mg/dL (ref 0.76–1.27)
GFR calc Af Amer: 126 mL/min/{1.73_m2} (ref >59)
GFR calc non Af Amer: 109 mL/min/{1.73_m2} (ref >59)
GLUCOSE: 99 mg/dL (ref 65–99)
Globulin, Total: 2.7 g/dL (ref 1.5–4.5)
POTASSIUM: 4.2 mmol/L (ref 3.5–5.2)
SODIUM: 136 mmol/L (ref 134–144)
Total Protein: 7.1 g/dL (ref 6.0–8.5)

## 2017-01-29 LAB — CBC WITH DIFFERENTIAL/PLATELET
BASOS ABS: 0 10*3/uL (ref 0.0–0.2)
Basos: 1 %
EOS (ABSOLUTE): 0.1 10*3/uL (ref 0.0–0.4)
Eos: 3 %
Hematocrit: 40 % (ref 37.5–51.0)
Hemoglobin: 12.9 g/dL — ABNORMAL LOW (ref 13.0–17.7)
Immature Grans (Abs): 0 10*3/uL (ref 0.0–0.1)
Immature Granulocytes: 0 %
LYMPHS ABS: 1.1 10*3/uL (ref 0.7–3.1)
Lymphs: 22 %
MCH: 31.5 pg (ref 26.6–33.0)
MCHC: 32.3 g/dL (ref 31.5–35.7)
MCV: 98 fL — ABNORMAL HIGH (ref 79–97)
MONOS ABS: 0.6 10*3/uL (ref 0.1–0.9)
Monocytes: 13 %
Neutrophils Absolute: 3.1 10*3/uL (ref 1.4–7.0)
Neutrophils: 61 %
Platelets: 216 10*3/uL (ref 150–379)
RBC: 4.1 x10E6/uL — ABNORMAL LOW (ref 4.14–5.80)
RDW: 14.4 % (ref 12.3–15.4)
WBC: 5 10*3/uL (ref 3.4–10.8)

## 2017-01-29 LAB — TSH: TSH: 1.51 u[IU]/mL (ref 0.450–4.500)

## 2017-02-05 ENCOUNTER — Encounter (INDEPENDENT_AMBULATORY_CARE_PROVIDER_SITE_OTHER): Payer: Self-pay | Admitting: Physician Assistant

## 2017-02-05 ENCOUNTER — Ambulatory Visit (INDEPENDENT_AMBULATORY_CARE_PROVIDER_SITE_OTHER): Payer: Self-pay | Admitting: Physician Assistant

## 2017-02-05 VITALS — BP 105/73 | HR 93 | Temp 97.6°F | Wt 145.4 lb

## 2017-02-05 DIAGNOSIS — Z4802 Encounter for removal of sutures: Secondary | ICD-10-CM

## 2017-02-05 NOTE — Patient Instructions (Signed)
Suture Removal, Care After Refer to this sheet in the next few weeks. These instructions provide you with information on caring for yourself after your procedure. Your health care provider may also give you more specific instructions. Your treatment has been planned according to current medical practices, but problems sometimes occur. Call your health care provider if you have any problems or questions after your procedure. What can I expect after the procedure? After your stitches (sutures) are removed, it is typical to have the following:  Some discomfort and swelling in the wound area.  Slight redness in the area.  Follow these instructions at home:  If you have skin adhesive strips over the wound area, do not take the strips off. They will fall off on their own in a few days. If the strips remain in place after 14 days, you may remove them.  Change any bandages (dressings) at least once a day or as directed by your health care provider. If the bandage sticks, soak it off with warm, soapy water.  Apply cream or ointment only as directed by your health care provider. If using cream or ointment, wash the area with soap and water 2 times a day to remove all the cream or ointment. Rinse off the soap and pat the area dry with a clean towel.  Keep the wound area dry and clean. If the bandage becomes wet or dirty, or if it develops a bad smell, change it as soon as possible.  Continue to protect the wound from injury.  Use sunscreen when out in the sun. New scars become sunburned easily. Contact a health care provider if:  You have increasing redness, swelling, or pain in the wound.  You see pus coming from the wound.  You have a fever.  You notice a bad smell coming from the wound or dressing.  Your wound breaks open (edges not staying together). This information is not intended to replace advice given to you by your health care provider. Make sure you discuss any questions you have  with your health care provider. Document Released: 04/21/2001 Document Revised: 01/02/2016 Document Reviewed: 03/08/2013 Elsevier Interactive Patient Education  2017 Elsevier Inc.  

## 2017-02-05 NOTE — Progress Notes (Signed)
  Subjective:  Patient ID: Michael Hayden, male    DOB: [birth year]  Age: 41 y.o. MRN: [id number]  CC: suture removal  HPI Michael Hayden is a 41 y.o. male with presents to have sutures removed. Sutures in right eyebrow for more than one month. Took some of the suture out himself.    Outpatient Medications Prior to Visit  Medication Sig Dispense Refill  . levETIRAcetam (KEPPRA) 500 MG tablet Take 1 tablet (500 mg total) by mouth 2 (two) times daily. 30 tablet 3   No facility-administered medications prior to visit.      ROS Review of Systems  Constitutional: Negative for chills, fever and malaise/fatigue.  Eyes: Negative for blurred vision.  Respiratory: Negative for shortness of breath.   Cardiovascular: Negative for chest pain and palpitations.  Gastrointestinal: Negative for abdominal pain and nausea.  Genitourinary: Negative for dysuria and hematuria.  Musculoskeletal: Negative for joint pain and myalgias.  Skin: Negative for rash.  Neurological: Negative for tingling and headaches.  Psychiatric/Behavioral: Negative for depression. The patient is not nervous/anxious.     Objective:  BP 105/73 (BP Location: Right Arm, Patient Position: Sitting, Cuff Size: Normal)   Pulse 93   Temp 97.6 F (36.4 C) (Oral)   Wt 145 lb 6.4 oz (66 kg)   SpO2 98%   BMI 21.16 kg/m   BP/Weight 02/05/2017 01/27/2017 03/11/2015  Systolic BP 105 128 125  Diastolic BP 73 87 70  Wt. (Lbs) 145.4 140.4 150  BMI 21.16 20.44 22.81      Physical Exam  Constitutional: He is oriented to person, place, and time.  Well developed, well nourished, NAD  HENT:  Head: Normocephalic and atraumatic.  Eyes: No scleral icterus.  Neurological: He is alert and oriented to person, place, and time. No cranial nerve deficit.  Skin: Skin is warm and dry. No rash noted. No erythema. No pallor.  No sign of infection around sutures. 6 sutures in place at the right eyebrow. Wound is well approximated and healed.   Psychiatric: He has a normal mood and affect. His behavior is normal. Thought content normal.  Vitals reviewed.    Assessment & Plan:   1. Visit for suture removal - 6 sutures removed today from right eyebrow.  No orders of the defined types were placed in this encounter.   Follow-up: Return if symptoms worsen or fail to improve.   Loletta Specteroger David Sheldon Sem PA

## 2017-02-12 ENCOUNTER — Telehealth (INDEPENDENT_AMBULATORY_CARE_PROVIDER_SITE_OTHER): Payer: Self-pay | Admitting: Physician Assistant

## 2017-02-12 NOTE — Telephone Encounter (Signed)
Spoke with patient 02/05/17 informed him he need appt for repeat labs.  He declined stated needs to speak to nurse or Sindy Messingoger Gomez PA to explain why he needs repeat labs.  I called patient back today to give appt, patient not home left message with his mother to call us back.

## 2017-02-12 NOTE — Telephone Encounter (Signed)
-----   Message from Margaretmary LombardNubia K [place], [place]MA sent at 02/04/2017  3:34 PM EDT ----- Please schedule patient for lab discussion and recheck.

## 2017-02-16 ENCOUNTER — Ambulatory Visit (INDEPENDENT_AMBULATORY_CARE_PROVIDER_SITE_OTHER): Payer: Self-pay | Admitting: Physician Assistant

## 2017-03-01 ENCOUNTER — Ambulatory Visit (INDEPENDENT_AMBULATORY_CARE_PROVIDER_SITE_OTHER): Payer: Self-pay | Admitting: Physician Assistant

## 2017-03-18 ENCOUNTER — Encounter (INDEPENDENT_AMBULATORY_CARE_PROVIDER_SITE_OTHER): Payer: Self-pay | Admitting: Physician Assistant

## 2017-03-18 ENCOUNTER — Ambulatory Visit (INDEPENDENT_AMBULATORY_CARE_PROVIDER_SITE_OTHER): Payer: Self-pay | Admitting: Physician Assistant

## 2017-03-18 VITALS — BP 124/88 | HR 112 | Temp 98.3°F | Wt 143.4 lb

## 2017-03-18 DIAGNOSIS — R740 Nonspecific elevation of levels of transaminase and lactic acid dehydrogenase [LDH]: Secondary | ICD-10-CM

## 2017-03-18 DIAGNOSIS — Z114 Encounter for screening for human immunodeficiency virus [HIV]: Secondary | ICD-10-CM

## 2017-03-18 DIAGNOSIS — R7401 Elevation of levels of liver transaminase levels: Secondary | ICD-10-CM

## 2017-03-18 NOTE — Progress Notes (Signed)
   Subjective:  Patient ID: Michael Hayden, male    DOB: Jul 15, 1976  Age: 41 y.o. MRN: [id number]  CC: explanation of labs  HPI Michael Hayden is a 41 y.o. male with a PMH of seizures and alcohol abuse presents to discuss abnormal labs. Has elevations in AST and ALT. He is also slightly anemic and has a slight increase in MCV.  Pt states he is asymptomatic. Still drinks occasionally, "maybe two to three times per week". Drinks beer, 40 oz at times. Does not endorse any other symptoms.    Outpatient Medications Prior to Visit  Medication Sig Dispense Refill  . levETIRAcetam (KEPPRA) 500 MG tablet Take 1 tablet (500 mg total) by mouth 2 (two) times daily. 30 tablet 3   No facility-administered medications prior to visit.      ROS Review of Systems  Constitutional: Negative for chills, fever and malaise/fatigue.  Eyes: Negative for blurred vision.  Respiratory: Negative for shortness of breath.   Cardiovascular: Negative for chest pain and palpitations.  Gastrointestinal: Negative for abdominal pain and nausea.  Genitourinary: Negative for dysuria and hematuria.  Musculoskeletal: Negative for joint pain and myalgias.  Skin: Negative for rash.  Neurological: Negative for tingling and headaches.  Psychiatric/Behavioral: Negative for depression. The patient is not nervous/anxious.     Objective:  BP 124/88 (BP Location: Right Arm, Patient Position: Sitting, Cuff Size: Normal)   Pulse (!) 112   Temp 98.3 F (36.8 C) (Oral)   Wt 143 lb 6.4 oz (65 kg)   SpO2 97%   BMI 20.87 kg/m   BP/Weight 03/18/2017 02/05/2017 01/27/2017  Systolic BP 124 105 128  Diastolic BP 88 73 87  Wt. (Lbs) 143.4 145.4 140.4  BMI 20.87 21.16 20.44      Physical Exam  Constitutional: He is oriented to person, place, and time.  Well developed, well nourished, NAD, polite  HENT:  Head: Normocephalic and atraumatic.  Eyes: No scleral icterus.  Cardiovascular: Normal rate, regular rhythm and normal  heart sounds.   Pulmonary/Chest: Effort normal and breath sounds normal.  Abdominal: Soft. Bowel sounds are normal. There is no tenderness.  Musculoskeletal: He exhibits no edema.  Neurological: He is alert and oriented to person, place, and time.  Skin: Skin is warm and dry. No rash noted. No erythema. No pallor.  Psychiatric: He has a normal mood and affect. His behavior is normal. Thought content normal.  Vitals reviewed.    Assessment & Plan:    1. Transaminitis - US Abdomen Limited RUQ; Future - Hepatitis panel, acute - Considerable time spent counselling patient on alcohol use. He is now in the contemplation phase after hearing of his liver enzyme results.   2. Encounter for screening for HIV - HIV antibody   Follow-up: Return in about 2 weeks (around 04/01/2017) for transaminitis.   Loletta Specteroger David Kaimani Clayson PA

## 2017-03-18 NOTE — Patient Instructions (Addendum)
Cirrhosis Cirrhosis is long-term (chronic) liver injury. The liver is your largest internal organ, and it performs many functions. The liver converts food into energy, removes toxic material from your blood, makes important proteins, and absorbs necessary vitamins from your diet. If you have cirrhosis, it means many of your healthy liver cells have been replaced by scar tissue. This prevents blood from flowing through your liver, which makes it difficult for your liver to function. This scarring is not reversible, but treatment can prevent it from getting worse. What are the causes? Hepatitis C and long-term alcohol abuse are the most common causes of cirrhosis. Other causes include:  Nonalcoholic fatty liver disease.  Hepatitis B infection.  Autoimmune hepatitis.  Diseases that cause blockage of ducts inside the liver.  Inherited liver diseases.  Reactions to certain long-term medicines.  Parasitic infections.  Long-term exposure to certain toxins.  What increases the risk? You may have a higher risk of cirrhosis if you:  Have certain hepatitis viruses.  Abuse alcohol, especially if you are male.  Are overweight.  Share needles.  Have unprotected sex with someone who has hepatitis.  What are the signs or symptoms? You may not have any signs and symptoms at first. Symptoms may not develop until the damage to your liver starts to get worse. Signs and symptoms of cirrhosis may include:  Tenderness in the right-upper part of your abdomen.  Weakness and tiredness (fatigue).  Loss of appetite.  Nausea.  Weight loss and muscle loss.  Itchiness.  Yellow skin and eyes (jaundice).  Buildup of fluid in the abdomen (ascites).  Swelling of the feet and ankles (edema).  Appearance of tiny blood vessels under the skin.  Mental confusion.  Easy bruising and bleeding.  How is this diagnosed? Your health care provider may suspect cirrhosis based on your symptoms and  medical history, especially if you have other medical conditions or a history of alcohol abuse. Your health care provider will do a physical exam to feel your liver and check for signs of cirrhosis. Your health care provider may perform other tests, including:  Blood tests to check: ? Whether you have hepatitis B or C. ? Kidney function. ? Liver function.  Imaging tests such as: ? MRI or CT scan to look for changes seen in advanced cirrhosis. ? Ultrasound to see if normal liver tissue is being replaced by scar tissue.  A procedure using a long needle to take a sample of liver tissue (biopsy) for examination under a microscope. Liver biopsy can confirm the diagnosis of cirrhosis.  How is this treated? Treatment depends on how damaged your liver is and what caused the damage. Treatment may include treating cirrhosis symptoms or treating the underlying causes of the condition to try to slow the progression of the damage. Treatment may include:  Making lifestyle changes, such as: ? Eating a healthy diet. ? Restricting salt intake. ? Maintaining a healthy weight. ? Not abusing drugs or alcohol.  Taking medicines to: ? Treat liver infections or other infections. ? Control itching. ? Reduce fluid buildup. ? Reduce certain blood toxins. ? Reduce risk of bleeding from enlarged blood vessels in the stomach or esophagus (varices).  If varices are causing bleeding problems, you may need treatment with a procedure that ties up the vessels causing them to fall off (band ligation).  If cirrhosis is causing your liver to fail, your health care provider may recommend a liver transplant.  Other treatments may be recommended depending on any complications   of cirrhosis, such as liver-related kidney failure (hepatorenal syndrome).  Follow these instructions at home:  Take medicines only as directed by your health care provider. Do not use drugs that are toxic to your liver. Ask your health care  provider before taking any new medicines, including over-the-counter medicines.  Rest as needed.  Eat a well-balanced diet. Ask your health care provider or dietitian for more information.  You may have to follow a low-salt diet or restrict your water intake as directed.  Do not drink alcohol. This is especially important if you are taking acetaminophen.  Keep all follow-up visits as directed by your health care provider. This is important. Contact a health care provider if:  You have fatigue or weakness that is getting worse.  You develop swelling of the hands, feet, legs, or face.  You have a fever.  You develop loss of appetite.  You have nausea or vomiting.  You develop jaundice.  You develop easy bruising or bleeding. Get help right away if:  You vomit bright red blood or a material that looks like coffee grounds.  You have blood in your stools.  Your stools appear black and tarry.  You become confused.  You have chest pain or trouble breathing. This information is not intended to replace advice given to you by your health care provider. Make sure you discuss any questions you have with your health care provider. Document Released: 07/27/2005 Document Revised: 12/05/2015 Document Reviewed: 04/04/2014 Elsevier Interactive Patient Education  2018 [employer]   WalgreenCommunity Resources  Advocacy/Legal Legal Aid [place]:  [phone]  /  [phone]  Family Justice Center:  [phone]  Family Service of the [facility] 24-hr Crisis line:  [phone]  [facility], [place]:  [phone]  Court Watch (custody):  [phone]  [facility] [employer] Humanitarian Law Clinic:   [phone]    Baby & Breastfeeding Car Seat Inspection @ Various [place] Equities traderire Depts.- call [phone]  [facility] Lactation  [phone]  [facility] Lactation [phone]  WIC: [phone] ([place]);  [phone] ([place])  [place] Leche League:  [phone]   Childcare [place] Child  Development: [phone] [facility]([place]) / [phone] ([place])  - Child Care Resources/ Referrals/ Scholarships  - Head Start/ Early Head Start (call or apply online)  [place] DHHS: [place] Pre-K :  [phone] / [phone]   Employment / Job Search [facility]'s Resource [facility] of [place]: [phone] / [address]  [place] [employer] (JobLink): [phone] ([place]) / [phone] ([place])  Triad Engineer, materialsGoodwill Community Resource/ Career Center: [phone] / [phone]  [facility] Job & Career Center: [phone]  DHHS Work First: [phone] ([place]) / [phone] ([place])  StepUp Ministry [place]:  [phone]   Financial Assistance [place] [facility]:  [phone]  [employer]: [phone]  Dominica SeverinBarnabas Network (furniture):  [phone]  [facility] Helping Hands: [phone]  Low Income Energy Assistance  [phone]   Food Assistance DHHS- SNAP/ Food Stamps: [phone]  WIC: Manley MasonGS([phone]- [phone] ;  [place] [phone]  Layne BentonLittle Green Book- Free Meals  Little Blue Book- Free Food Pantries  During the summer, text "FOOD" to [phone]   General Health / Clinics (Adults) Orange Card (for Adults) through [facility]: [phone]  [facility]:   [phone]  [facility] Community Health & Wellness:   [phone]  Health Department:  [phone]  [facility]:  [phone] / [phone]  Planned Parenthood of [place]:   [phone]  [facility]:   [phone]   Housing [place] Housing Coalition:  669-278-5834  [facility] Housing Authority:  6136840369  Affordable Housing Managemnt:  (316) [phone]   [facility] for [place]):  ([phone]  Faith Action International House:  (650)[phone]  New Arrivals Institute:  (510)[phone]  Parks Ranger Services:  507-226-4956  African Services Coalition:  (724)426-4078   LGBTQ YouthSAFE  www.youthsafegso.org  PFLAG  518-841-6606  / [place]  The Beryle Beams Project:  (204)233-2727   Mental Health/ Substance Use Family Service of the [facility]  [phone]  [facility]:  ([phone] or [phone]  [facility] of Care:  [phone]  Journeys Counseling:  ([phone]  [facility] Care Services:  [phone]  Vesta Mixer (walk-ins)  ([phone] / [address]  [place]:  [phone]  Alcoholics Anonymous:  [phone]  Narcotics Anonymous:  807-284-1247  Quit Smoking Hotline:  800-QUIT-NOW 9307398671)   Parenting Children's Home Society:  747-053-9816  [facility]: Education Center & Support Groups:  4455178354  YWCA: 4013890843  [employer]: Bringing Out the Best:  248-168-3222               Thriving at Three (Hispanic families): 424-572-1774  Healthy Start (Family Service of the [place]):  740-759-3338 [phone]  Parents as Teachers:  (224)[phone]  [place] Child Development- Learning Together (Immigrants): 316-590-4275   Poison Control 361-743-2466  Sports & Recreation [employer] Open Doors Application: https://www.rich.com/  [place] of [place] Recreation Centers: http://www.[place].gov/index.aspx?page=3615   Special Needs Family Support Network:  769-796-4078  Autism Society of [place]:   502-084-7727 [phone])[phone] or 616-013-1694 /  301-416-4583  [facility]:  [phone]  ARC of [place]:  (816)[phone]  Children's Developmental Service Agency (CDSA):  870-228-3738  [facility] (Care Coordination for Children):  [phone]   Transportation Medicaid Transportation: 980-359-3677 to apply  [place]allie Piles Authority: 203-878-9360 (reduced-fare bus ID to Medicaid/ Medicare/ Orange Card)  SCAT Paratransit services: Eligible riders only, call 581-081-5773 for application   Tutoring/Mentoring Black Child Development Institute: 251-240-3926  [facility] Brothers/ Big Sisters: [phone])[phone] ([place])  ACES through child's school: (661)058-5626  YM[employer] Achievers:  contact your local Y  SHIELD Mentor Program: 3134250494

## 2017-03-19 LAB — HEPATITIS PANEL, ACUTE
HEP A IGM: NEGATIVE
HEP B C IGM: NEGATIVE
HEP B S AG: NEGATIVE
Hep C Virus Ab: <0.1 s/co ratio (ref 0.0–0.9)

## 2017-03-19 LAB — HIV ANTIBODY (ROUTINE TESTING W REFLEX): HIV SCREEN 4TH GENERATION: NONREACTIVE

## 2017-03-22 ENCOUNTER — Encounter (INDEPENDENT_AMBULATORY_CARE_PROVIDER_SITE_OTHER): Payer: Self-pay

## 2017-03-25 ENCOUNTER — Telehealth (INDEPENDENT_AMBULATORY_CARE_PROVIDER_SITE_OTHER): Payer: Self-pay | Admitting: Physician Assistant

## 2017-03-25 ENCOUNTER — Ambulatory Visit (HOSPITAL_COMMUNITY): Payer: Self-pay

## 2017-03-25 NOTE — Telephone Encounter (Signed)
Spoke with patient and clarified everything. Patient will rescheduled US on his own. Maryjean Mornempestt S Shellyann Wandrey, CMA

## 2017-03-25 NOTE — Telephone Encounter (Signed)
Patient was confused he thought that he had an appointment here today at 11am but his appt was at  for his ultrasound . Patient would like a call back regarding his lab results and his ultrasound appointment Thank You

## 2017-04-29 ENCOUNTER — Ambulatory Visit (INDEPENDENT_AMBULATORY_CARE_PROVIDER_SITE_OTHER): Payer: Self-pay | Admitting: Physician Assistant

## 2017-05-03 ENCOUNTER — Ambulatory Visit (HOSPITAL_COMMUNITY)
Admission: RE | Admit: 2017-05-03 | Discharge: 2017-05-03 | Disposition: A | Discharge status: Home / Self Care | Payer: Self-pay | Source: Ambulatory Visit | Attending: Physician Assistant | Admitting: Physician Assistant

## 2017-05-03 DIAGNOSIS — K8020 Calculus of gallbladder without cholecystitis without obstruction: Secondary | ICD-10-CM | POA: Insufficient documentation

## 2017-05-03 DIAGNOSIS — K769 Liver disease, unspecified: Secondary | ICD-10-CM | POA: Insufficient documentation

## 2017-05-03 DIAGNOSIS — R740 Nonspecific elevation of levels of transaminase and lactic acid dehydrogenase [LDH]: Secondary | ICD-10-CM

## 2017-05-03 DIAGNOSIS — R7401 Elevation of levels of liver transaminase levels: Secondary | ICD-10-CM

## 2017-05-05 ENCOUNTER — Telehealth (INDEPENDENT_AMBULATORY_CARE_PROVIDER_SITE_OTHER): Payer: Self-pay

## 2017-05-05 ENCOUNTER — Encounter (INDEPENDENT_AMBULATORY_CARE_PROVIDER_SITE_OTHER): Payer: Self-pay

## 2017-05-05 NOTE — Telephone Encounter (Signed)
Called patient on both numbers on file, one number rings several times and disconnects, the other number belongs to a woman, did not leave message. Results mailed to patient. Maryjean Morn, CMA

## 2017-05-05 NOTE — Telephone Encounter (Signed)
-----   Message from Loletta Specter, PA-C sent at 05/03/2017  6:13 PM EDT ----- Fatty liver and some gallstones but no gallbladder inflammation.

## 2017-05-10 ENCOUNTER — Ambulatory Visit (INDEPENDENT_AMBULATORY_CARE_PROVIDER_SITE_OTHER): Payer: Self-pay | Admitting: Physician Assistant

## 2017-05-10 ENCOUNTER — Encounter (INDEPENDENT_AMBULATORY_CARE_PROVIDER_SITE_OTHER): Payer: Self-pay | Admitting: Physician Assistant

## 2017-05-10 VITALS — BP 138/90 | HR 105 | Temp 98.2°F

## 2017-05-10 DIAGNOSIS — Z23 Encounter for immunization: Secondary | ICD-10-CM

## 2017-05-10 DIAGNOSIS — F17200 Nicotine dependence, unspecified, uncomplicated: Secondary | ICD-10-CM

## 2017-05-10 DIAGNOSIS — Z0000 Encounter for general adult medical examination without abnormal findings: Secondary | ICD-10-CM

## 2017-05-10 NOTE — Progress Notes (Signed)
   Subjective:  Patient ID: Michael Hayden, male    DOB: 01/10/1976  Age: 41 y.o. MRN: [id number]  CC: annual physical  HPI  Michael Hayden is a 41 y.o. male with a PMH of seizures and alcohol abuse presents for an annual physical. Says he is well and decreased his alcohol consumption considerably since his last LFTs were conducted. Does not endorse any symptoms or complaints.     Outpatient Medications Prior to Visit  Medication Sig Dispense Refill  . levETIRAcetam (KEPPRA) 500 MG tablet Take 1 tablet (500 mg total) by mouth 2 (two) times daily. 30 tablet 3   No facility-administered medications prior to visit.      ROS Review of Systems  Constitutional: Negative for chills, fever and malaise/fatigue.  Eyes: Negative for blurred vision.  Respiratory: Negative for shortness of breath.   Cardiovascular: Negative for chest pain and palpitations.  Gastrointestinal: Negative for abdominal pain and nausea.  Genitourinary: Negative for dysuria and hematuria.  Musculoskeletal: Negative for joint pain and myalgias.  Skin: Negative for rash.  Neurological: Negative for tingling and headaches.  Psychiatric/Behavioral: Negative for depression. The patient is not nervous/anxious.     Objective:  There were no vitals taken for this visit.  BP/Weight 03/18/2017 02/05/2017 01/27/2017  Systolic BP 124 105 128  Diastolic BP 88 73 87  Wt. (Lbs) 143.4 145.4 140.4  BMI 20.87 21.16 20.44      Physical Exam  Constitutional: He is oriented to person, place, and time.  Thin, NAD, polite  HENT:  Head: Normocephalic and atraumatic.  Mouth/Throat: No oropharyngeal exudate.  Eyes: No scleral icterus.  Neck: Normal range of motion. Neck supple. No thyromegaly present.  Cardiovascular: Normal rate, regular rhythm and normal heart sounds.   Pulmonary/Chest: Effort normal and breath sounds normal. No respiratory distress. He has no wheezes. He has no rales.  Abdominal: Soft. Bowel sounds are  normal. He exhibits no distension. There is no tenderness. There is no rebound and no guarding.  No hepatomegaly  Genitourinary:  Genitourinary Comments: Pt declines DRE  Musculoskeletal: He exhibits no edema.  UEs, LEs, and back with full aROM.   Lymphadenopathy:    He has no cervical adenopathy.  Neurological: He is alert and oriented to person, place, and time. He has normal reflexes. No cranial nerve deficit. Coordination normal.  Skin: Skin is warm and dry. No rash noted. No erythema. No pallor.  Yellowish tint to fingernails of the first and second digits of right hand.   Psychiatric: He has a normal mood and affect. His behavior is normal. Thought content normal.  Vitals reviewed.    Assessment & Plan:   1. Annual physical exam - Comprehensive metabolic panel - Declined PSA and DRE  2. Need for prophylactic vaccination and inoculation against influenza - Flu Vaccine QUAD 6+ mos PF IM (Fluarix Quad PF)  3. Tobacco use disorder - Pt declined treatment.      Follow-up: Return if symptoms worsen or fail to improve.   Loletta Specter PA

## 2017-05-10 NOTE — Patient Instructions (Signed)

## 2017-05-11 LAB — COMPREHENSIVE METABOLIC PANEL
A/G RATIO: 1.5 (ref 1.2–2.2)
ALBUMIN: 4.3 g/dL (ref 3.5–5.5)
ALT: 23 IU/L (ref 0–44)
AST: 33 IU/L (ref 0–40)
Alkaline Phosphatase: 62 IU/L (ref 39–117)
BUN/Creatinine Ratio: 6 — ABNORMAL LOW (ref 9–20)
BUN: 5 mg/dL — ABNORMAL LOW (ref 6–24)
Bilirubin Total: <0.2 mg/dL (ref 0.0–1.2)
CALCIUM: 9 mg/dL (ref 8.7–10.2)
CO2: 25 mmol/L (ref 20–29)
CREATININE: 0.83 mg/dL (ref 0.76–1.27)
Chloride: 104 mmol/L (ref 96–106)
GFR calc Af Amer: 127 mL/min/{1.73_m2} (ref >59)
GFR, EST NON AFRICAN AMERICAN: 110 mL/min/{1.73_m2} (ref >59)
GLOBULIN, TOTAL: 2.8 g/dL (ref 1.5–4.5)
Glucose: 92 mg/dL (ref 65–99)
Potassium: 4.5 mmol/L (ref 3.5–5.2)
SODIUM: 142 mmol/L (ref 134–144)
Total Protein: 7.1 g/dL (ref 6.0–8.5)

## 2017-05-12 ENCOUNTER — Telehealth (INDEPENDENT_AMBULATORY_CARE_PROVIDER_SITE_OTHER): Payer: Self-pay

## 2017-05-12 NOTE — Telephone Encounter (Signed)
-----   Message from Roger David Gomez, PA-C sent at 05/11/2017  5:29 PM EDT ----- Liver enzymes have normalized. I recommend he not drink alcohol to avoid future complications. 

## 2017-05-12 NOTE — Telephone Encounter (Signed)
Called patient, phone rang but did not give option to leave message. Maryjean Morn, CMA

## 2017-05-14 ENCOUNTER — Telehealth (INDEPENDENT_AMBULATORY_CARE_PROVIDER_SITE_OTHER): Payer: Self-pay

## 2017-05-14 ENCOUNTER — Encounter (INDEPENDENT_AMBULATORY_CARE_PROVIDER_SITE_OTHER): Payer: Self-pay

## 2017-05-14 NOTE — Telephone Encounter (Signed)
-----   Message from Loletta Specter, PA-C sent at 05/11/2017  5:29 PM EDT ----- Liver enzymes have normalized. I recommend he not drink alcohol to avoid future complications.

## 2017-05-14 NOTE — Telephone Encounter (Signed)
Have attempted to reach patient at all numbers on file, one belongs to a woman, no one is listed on patients DPR so I did not leave a message. The other number rings several times and then disconnects. Results mailed to patient. Maryjean Morn, CMA

## 2017-06-22 ENCOUNTER — Emergency Department (HOSPITAL_COMMUNITY): Payer: Self-pay

## 2017-06-22 ENCOUNTER — Observation Stay (HOSPITAL_COMMUNITY)
Admission: EM | Admit: 2017-06-22 | Discharge: 2017-06-23 | Disposition: A | Discharge status: Home / Self Care | Payer: Self-pay | Attending: Vascular Surgery | Admitting: Vascular Surgery

## 2017-06-22 ENCOUNTER — Emergency Department (HOSPITAL_COMMUNITY): Payer: Self-pay | Admitting: Certified Registered Nurse Anesthetist

## 2017-06-22 ENCOUNTER — Encounter (HOSPITAL_COMMUNITY)
Admission: EM | Disposition: A | Discharge status: Home / Self Care | Payer: Self-pay | Source: Home / Self Care | Attending: Emergency Medicine

## 2017-06-22 DIAGNOSIS — Z23 Encounter for immunization: Secondary | ICD-10-CM | POA: Insufficient documentation

## 2017-06-22 DIAGNOSIS — S71111A Laceration without foreign body, right thigh, initial encounter: Principal | ICD-10-CM | POA: Insufficient documentation

## 2017-06-22 DIAGNOSIS — Y92009 Unspecified place in unspecified non-institutional (private) residence as the place of occurrence of the external cause: Secondary | ICD-10-CM | POA: Insufficient documentation

## 2017-06-22 DIAGNOSIS — F10129 Alcohol abuse with intoxication, unspecified: Secondary | ICD-10-CM | POA: Insufficient documentation

## 2017-06-22 DIAGNOSIS — T148XXA Other injury of unspecified body region, initial encounter: Secondary | ICD-10-CM | POA: Diagnosis present

## 2017-06-22 DIAGNOSIS — S75891A Other specified injury of other blood vessels at hip and thigh level, right leg, initial encounter: Secondary | ICD-10-CM

## 2017-06-22 DIAGNOSIS — F17200 Nicotine dependence, unspecified, uncomplicated: Secondary | ICD-10-CM | POA: Insufficient documentation

## 2017-06-22 HISTORY — DX: Unspecified convulsions: R56.9

## 2017-06-22 HISTORY — PX: WOUND EXPLORATION: SHX6188

## 2017-06-22 LAB — COMPREHENSIVE METABOLIC PANEL
ALBUMIN: 4 g/dL (ref 3.5–5.0)
ALK PHOS: 62 U/L (ref 38–126)
ALT: 77 U/L — ABNORMAL HIGH (ref 17–63)
ANION GAP: 12 (ref 5–15)
AST: 103 U/L — ABNORMAL HIGH (ref 15–41)
BILIRUBIN TOTAL: 0.5 mg/dL (ref 0.3–1.2)
BUN: <5 mg/dL — ABNORMAL LOW (ref 6–20)
CALCIUM: 8.6 mg/dL — AB (ref 8.9–10.3)
CO2: 22 mmol/L (ref 22–32)
Chloride: 100 mmol/L — ABNORMAL LOW (ref 101–111)
Creatinine, Ser: 0.68 mg/dL (ref 0.61–1.24)
GLUCOSE: 81 mg/dL (ref 65–99)
POTASSIUM: 3.6 mmol/L (ref 3.5–5.1)
Sodium: 134 mmol/L — ABNORMAL LOW (ref 135–145)
TOTAL PROTEIN: 7.2 g/dL (ref 6.5–8.1)

## 2017-06-22 LAB — I-STAT CG4 LACTIC ACID, ED: LACTIC ACID, VENOUS: 2.46 mmol/L — AB (ref 0.5–1.9)

## 2017-06-22 LAB — CBC
HEMATOCRIT: 37.6 % — AB (ref 39.0–52.0)
HEMOGLOBIN: 13.1 g/dL (ref 13.0–17.0)
MCH: 31.8 pg (ref 26.0–34.0)
MCHC: 34.8 g/dL (ref 30.0–36.0)
MCV: 91.3 fL (ref 78.0–100.0)
Platelets: 252 10*3/uL (ref 150–400)
RBC: 4.12 MIL/uL — AB (ref 4.22–5.81)
RDW: 14.7 % (ref 11.5–15.5)
WBC: 4.3 10*3/uL (ref 4.0–10.5)

## 2017-06-22 LAB — I-STAT CHEM 8, ED
CALCIUM ION: 1.01 mmol/L — AB (ref 1.15–1.40)
CHLORIDE: 99 mmol/L — AB (ref 101–111)
Creatinine, Ser: 1.1 mg/dL (ref 0.61–1.24)
GLUCOSE: 82 mg/dL (ref 65–99)
HCT: 41 % (ref 39.0–52.0)
Hemoglobin: 13.9 g/dL (ref 13.0–17.0)
Potassium: 3.7 mmol/L (ref 3.5–5.1)
SODIUM: 135 mmol/L (ref 135–145)
TCO2: 26 mmol/L (ref 22–32)

## 2017-06-22 LAB — PROTIME-INR
INR: 0.93
PROTHROMBIN TIME: 12.3 s (ref 11.4–15.2)

## 2017-06-22 LAB — ETHANOL: ALCOHOL ETHYL (B): 386 mg/dL — AB (ref <10)

## 2017-06-22 LAB — CDS SEROLOGY

## 2017-06-22 SURGERY — WOUND EXPLORATION
Anesthesia: General | Site: Thigh | Laterality: Right

## 2017-06-22 MED ORDER — ONDANSETRON HCL 4 MG/2ML IJ SOLN: 4.0000 mg | Freq: Four times a day (QID) | INTRAMUSCULAR | Status: DC | PRN

## 2017-06-22 MED ORDER — PANTOPRAZOLE SODIUM 40 MG PO TBEC
40.0000 mg | DELAYED_RELEASE_TABLET | Freq: Every day | ORAL | Status: DC
  Administered 2017-06-23: 40 mg via ORAL
  Filled 2017-06-22: qty 1

## 2017-06-22 MED ORDER — HYDROMORPHONE HCL 1 MG/ML IJ SOLN
INTRAMUSCULAR | Status: AC
  Filled 2017-06-22: qty 1

## 2017-06-22 MED ORDER — PROPOFOL 10 MG/ML IV BOLUS
INTRAVENOUS | Status: DC | PRN
  Administered 2017-06-22: 30 mg via INTRAVENOUS
  Administered 2017-06-22: 200 mg via INTRAVENOUS

## 2017-06-22 MED ORDER — LIDOCAINE HCL (CARDIAC) 20 MG/ML IV SOLN
INTRAVENOUS | Status: DC | PRN
  Administered 2017-06-22: 60 mg via INTRAVENOUS

## 2017-06-22 MED ORDER — PROMETHAZINE HCL 25 MG/ML IJ SOLN: 6.2500 mg | INTRAMUSCULAR | Status: DC | PRN

## 2017-06-22 MED ORDER — POTASSIUM CHLORIDE CRYS ER 20 MEQ PO TBCR
20.0000 meq | EXTENDED_RELEASE_TABLET | Freq: Once | ORAL | Status: AC
  Administered 2017-06-23: 20 meq via ORAL
  Filled 2017-06-22: qty 1

## 2017-06-22 MED ORDER — SODIUM CHLORIDE 0.9 % IV SOLN
INTRAVENOUS | Status: DC | PRN
  Administered 2017-06-22: 1 mL

## 2017-06-22 MED ORDER — CEFAZOLIN SODIUM-DEXTROSE 2-3 GM-%(50ML) IV SOLR
INTRAVENOUS | Status: DC | PRN
  Administered 2017-06-22: 2 g via INTRAVENOUS

## 2017-06-22 MED ORDER — INFLUENZA VAC SPLIT QUAD 0.5 ML IM SUSY: 0.5000 mL | PREFILLED_SYRINGE | INTRAMUSCULAR | Status: DC

## 2017-06-22 MED ORDER — THROMBIN 20000 UNITS EX KIT
PACK | CUTANEOUS | Status: AC
  Filled 2017-06-22: qty 1

## 2017-06-22 MED ORDER — TETANUS-DIPHTH-ACELL PERTUSSIS 5-2.5-18.5 LF-MCG/0.5 IM SUSP
INTRAMUSCULAR | Status: AC
  Filled 2017-06-22: qty 0.5

## 2017-06-22 MED ORDER — KCL IN DEXTROSE-NACL 20-5-0.45 MEQ/L-%-% IV SOLN: INTRAVENOUS | Status: AC

## 2017-06-22 MED ORDER — GUAIFENESIN-DM 100-10 MG/5ML PO SYRP: 15.0000 mL | ORAL_SOLUTION | ORAL | Status: DC | PRN

## 2017-06-22 MED ORDER — EPHEDRINE SULFATE 50 MG/ML IJ SOLN
INTRAMUSCULAR | Status: DC | PRN
  Administered 2017-06-22: 5 mg via INTRAVENOUS

## 2017-06-22 MED ORDER — PHENOL 1.4 % MT LIQD: 1.0000 | OROMUCOSAL | Status: DC | PRN

## 2017-06-22 MED ORDER — METOPROLOL TARTRATE 5 MG/5ML IV SOLN: 2.0000 mg | INTRAVENOUS | Status: DC | PRN

## 2017-06-22 MED ORDER — OXYCODONE HCL 5 MG PO TABS: 5.0000 mg | ORAL_TABLET | Freq: Once | ORAL | Status: DC | PRN

## 2017-06-22 MED ORDER — HYDRALAZINE HCL 20 MG/ML IJ SOLN: 5.0000 mg | INTRAMUSCULAR | Status: DC | PRN

## 2017-06-22 MED ORDER — ALUM & MAG HYDROXIDE-SIMETH 200-200-20 MG/5ML PO SUSP: 15.0000 mL | ORAL | Status: DC | PRN

## 2017-06-22 MED ORDER — ACETAMINOPHEN 650 MG RE SUPP: 325.0000 mg | RECTAL | Status: DC | PRN

## 2017-06-22 MED ORDER — 0.9 % SODIUM CHLORIDE (POUR BTL) OPTIME
TOPICAL | Status: DC | PRN
  Administered 2017-06-22: 1000 mL

## 2017-06-22 MED ORDER — OXYCODONE HCL 5 MG/5ML PO SOLN: 5.0000 mg | Freq: Once | ORAL | Status: DC | PRN

## 2017-06-22 MED ORDER — LABETALOL HCL 5 MG/ML IV SOLN: 10.0000 mg | INTRAVENOUS | Status: DC | PRN

## 2017-06-22 MED ORDER — TETANUS-DIPHTH-ACELL PERTUSSIS 5-2.5-18.5 LF-MCG/0.5 IM SUSP
0.5000 mL | Freq: Once | INTRAMUSCULAR | Status: AC
  Administered 2017-06-22: 0.5 mL via INTRAMUSCULAR

## 2017-06-22 MED ORDER — LACTATED RINGERS IV SOLN
INTRAVENOUS | Status: DC | PRN
  Administered 2017-06-22 (×2): via INTRAVENOUS

## 2017-06-22 MED ORDER — SUCCINYLCHOLINE CHLORIDE 20 MG/ML IJ SOLN
INTRAMUSCULAR | Status: DC | PRN
  Administered 2017-06-22: 70 mg via INTRAVENOUS

## 2017-06-22 MED ORDER — PROPOFOL 10 MG/ML IV BOLUS
INTRAVENOUS | Status: AC
  Filled 2017-06-22: qty 20

## 2017-06-22 MED ORDER — FENTANYL CITRATE (PF) 250 MCG/5ML IJ SOLN
INTRAMUSCULAR | Status: AC
  Filled 2017-06-22: qty 5

## 2017-06-22 MED ORDER — SODIUM CHLORIDE 0.9 % IV BOLUS (SEPSIS)
1000.0000 mL | Freq: Once | INTRAVENOUS | Status: AC
  Administered 2017-06-22: 1000 mL via INTRAVENOUS

## 2017-06-22 MED ORDER — ONDANSETRON HCL 4 MG/2ML IJ SOLN
INTRAMUSCULAR | Status: DC | PRN
  Administered 2017-06-22: 4 mg via INTRAVENOUS

## 2017-06-22 MED ORDER — HYDROMORPHONE HCL 1 MG/ML IJ SOLN
0.2500 mg | INTRAMUSCULAR | Status: DC | PRN
  Administered 2017-06-22: 0.5 mg via INTRAVENOUS

## 2017-06-22 MED ORDER — OXYCODONE-ACETAMINOPHEN 5-325 MG PO TABS
1.0000 | ORAL_TABLET | ORAL | Status: DC | PRN
  Administered 2017-06-23: 2 via ORAL
  Filled 2017-06-22: qty 2

## 2017-06-22 MED ORDER — ACETAMINOPHEN 325 MG PO TABS: 325.0000 mg | ORAL_TABLET | ORAL | Status: DC | PRN

## 2017-06-22 MED ORDER — FENTANYL CITRATE (PF) 100 MCG/2ML IJ SOLN
INTRAMUSCULAR | Status: DC | PRN
  Administered 2017-06-22: 100 ug via INTRAVENOUS
  Administered 2017-06-22: 50 ug via INTRAVENOUS

## 2017-06-22 MED ORDER — DEXAMETHASONE SODIUM PHOSPHATE 10 MG/ML IJ SOLN
INTRAMUSCULAR | Status: DC | PRN
  Administered 2017-06-22: 10 mg via INTRAVENOUS

## 2017-06-22 SURGICAL SUPPLY — 60 items
ADH SKN CLS APL DERMABOND .7 (GAUZE/BANDAGES/DRESSINGS) ×1
BANDAGE ACE 4X5 VEL STRL LF (GAUZE/BANDAGES/DRESSINGS) ×2 IMPLANT
BANDAGE ESMARK 6X9 LF (GAUZE/BANDAGES/DRESSINGS) IMPLANT
BNDG ADH 5X4 AIR PERM ELC (GAUZE/BANDAGES/DRESSINGS) ×1
BNDG CMPR 9X6 STRL LF SNTH (GAUZE/BANDAGES/DRESSINGS)
BNDG COHESIVE 4X5 WHT NS (GAUZE/BANDAGES/DRESSINGS) ×2 IMPLANT
BNDG ESMARK 6X9 LF (GAUZE/BANDAGES/DRESSINGS)
BNDG GAUZE ELAST 4 BULKY (GAUZE/BANDAGES/DRESSINGS) ×2 IMPLANT
CANISTER SUCT 3000ML PPV (MISCELLANEOUS) ×3 IMPLANT
CANNULA VESSEL 3MM 2 BLNT TIP (CANNULA) ×2 IMPLANT
CANNULA VESSEL W/WING W/VALVE (CANNULA) ×3 IMPLANT
CLIP VESOCCLUDE MED 24/CT (CLIP) ×3 IMPLANT
CLIP VESOCCLUDE SM WIDE 24/CT (CLIP) ×3 IMPLANT
CUFF TOURNIQUET SINGLE 24IN (TOURNIQUET CUFF) IMPLANT
CUFF TOURNIQUET SINGLE 34IN LL (TOURNIQUET CUFF) IMPLANT
CUFF TOURNIQUET SINGLE 44IN (TOURNIQUET CUFF) IMPLANT
DERMABOND ADVANCED (GAUZE/BANDAGES/DRESSINGS) ×2
DERMABOND ADVANCED .7 DNX12 (GAUZE/BANDAGES/DRESSINGS) IMPLANT
DRAIN CHANNEL 15F RND FF W/TCR (WOUND CARE) IMPLANT
DRAIN PENROSE 3/4X12 (DRAIN) IMPLANT
DRAPE X-RAY CASS 24X20 (DRAPES) IMPLANT
ELECT REM PT RETURN 9FT ADLT (ELECTROSURGICAL) ×3
ELECTRODE REM PT RTRN 9FT ADLT (ELECTROSURGICAL) ×1 IMPLANT
EVACUATOR SILICONE 100CC (DRAIN) IMPLANT
GAUZE SPONGE 4X4 12PLY STRL (GAUZE/BANDAGES/DRESSINGS) ×2 IMPLANT
GLOVE BIO SURGEON STRL SZ7.5 (GLOVE) ×3 IMPLANT
GLOVE BIOGEL PI IND STRL 8 (GLOVE) ×1 IMPLANT
GLOVE BIOGEL PI INDICATOR 8 (GLOVE) ×2
GOWN STRL REUS W/ TWL LRG LVL3 (GOWN DISPOSABLE) ×3 IMPLANT
GOWN STRL REUS W/TWL LRG LVL3 (GOWN DISPOSABLE) ×9
KIT BASIN OR (CUSTOM PROCEDURE TRAY) ×3 IMPLANT
KIT ROOM TURNOVER OR (KITS) ×3 IMPLANT
MARKER GRAFT CORONARY BYPASS (MISCELLANEOUS) IMPLANT
NS IRRIG 1000ML POUR BTL (IV SOLUTION) ×4 IMPLANT
PACK PERIPHERAL VASCULAR (CUSTOM PROCEDURE TRAY) ×3 IMPLANT
PAD ARMBOARD 7.5X6 YLW CONV (MISCELLANEOUS) ×6 IMPLANT
SET COLLECT BLD 21X3/4 12 (NEEDLE) IMPLANT
SPONGE SURGIFOAM ABS GEL 100 (HEMOSTASIS) IMPLANT
STAPLER VISISTAT (STAPLE) IMPLANT
STAPLER VISISTAT 35W (STAPLE) ×2 IMPLANT
STOPCOCK 4 WAY LG BORE MALE ST (IV SETS) IMPLANT
SUCTION FRAZIER HANDLE 10FR (MISCELLANEOUS) ×2
SUCTION TUBE FRAZIER 10FR DISP (MISCELLANEOUS) IMPLANT
SUT ETHILON 3 0 PS 1 (SUTURE) IMPLANT
SUT PROLENE 5 0 C 1 24 (SUTURE) ×1 IMPLANT
SUT PROLENE 6 0 BV (SUTURE) ×3 IMPLANT
SUT PROLENE 7 0 BV 1 (SUTURE) IMPLANT
SUT SILK 2 0 PERMA HAND 18 BK (SUTURE) ×3 IMPLANT
SUT SILK 3 0 (SUTURE)
SUT SILK 3-0 18XBRD TIE 12 (SUTURE) IMPLANT
SUT VIC AB 2-0 CTB1 (SUTURE) ×3 IMPLANT
SUT VIC AB 3-0 SH 27 (SUTURE) ×6
SUT VIC AB 3-0 SH 27X BRD (SUTURE) ×2 IMPLANT
SUT VIC AB 4-0 PS2 18 (SUTURE) ×3 IMPLANT
SUT VICRYL 4-0 PS2 18IN ABS (SUTURE) ×4 IMPLANT
TAPE PAPER 3X10 WHT MICROPORE (GAUZE/BANDAGES/DRESSINGS) ×2 IMPLANT
TRAY FOLEY W/METER SILVER 16FR (SET/KITS/TRAYS/PACK) ×1 IMPLANT
TUBING EXTENTION W/L.L. (IV SETS) IMPLANT
UNDERPAD 30X30 (UNDERPADS AND DIAPERS) ×3 IMPLANT
WATER STERILE IRR 1000ML POUR (IV SOLUTION) ×3 IMPLANT

## 2017-06-22 NOTE — Transfer of Care (Signed)
Immediate Anesthesia Transfer of Care Note  Patient: Michael Hayden  Procedure(s) Performed: WOUND EXPLORATION RIGHT THIGH (Right Thigh)  Patient Location: PACU  Anesthesia Type:General  Level of Consciousness: drowsy  Airway & Oxygen Therapy: Patient Spontanous Breathing and Patient connected to face mask oxygen  Post-op Assessment: Report given to RN and Post -op Vital signs reviewed and stable  Post vital signs: Reviewed and stable  Last Vitals:  Vitals:   06/22/17 1915 06/22/17 2124  BP: 115/76 (!) 149/85  Pulse: 76 (!) 110  Resp: (!) 23 (!) 21  Temp:  (!) 36.3 C  SpO2: 97% 93%    Last Pain:  Vitals:   06/22/17 1848  TempSrc:   PainSc: 0-No pain         Complications: No apparent anesthesia complications
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
## 2017-06-22 NOTE — Anesthesia Procedure Notes (Signed)
Procedure Name: Intubation Date/Time: 06/22/2017 8:11 PM Performed by: Claudina LickMahony, Felicia Bloomquist D, CRNA Pre-anesthesia Checklist: Patient identified, Emergency Drugs available, Suction available and Patient being monitored Patient Re-evaluated:Patient Re-evaluated prior to induction Oxygen Delivery Method: Circle system utilized Preoxygenation: Pre-oxygenation with 100% oxygen Induction Type: IV induction, Rapid sequence and Cricoid Pressure applied Laryngoscope Size: Miller and 2 Grade View: Grade II Tube type: Oral Tube size: 7.5 mm Number of attempts: 1 Airway Equipment and Method: Stylet Placement Confirmation: ETT inserted through vocal cords under direct vision,  positive ETCO2 and breath sounds checked- equal and bilateral Secured at: 25 cm Dental Injury: Teeth and Oropharynx as per pre-operative assessment
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
## 2017-06-22 NOTE — ED Notes (Signed)
Pt's blue hat, 1 pair blue jeans, 1 pair tennis shoes, 1 belt taken with pt to the OR
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
## 2017-06-22 NOTE — ED Notes (Signed)
I stat lactic acid results given to Dr. Eudelia Bunchardama by B. Bing PlumeHaynes, EMT

## 2017-06-23 ENCOUNTER — Encounter (HOSPITAL_COMMUNITY): Payer: Self-pay

## 2017-06-23 ENCOUNTER — Other Ambulatory Visit: Payer: Self-pay

## 2017-06-23 LAB — CBC
HCT: 34.7 % — ABNORMAL LOW (ref 39.0–52.0)
Hemoglobin: 11.8 g/dL — ABNORMAL LOW (ref 13.0–17.0)
MCH: 31.3 pg (ref 26.0–34.0)
MCHC: 34 g/dL (ref 30.0–36.0)
MCV: 92 fL (ref 78.0–100.0)
Platelets: 213 10*3/uL (ref 150–400)
RBC: 3.77 MIL/uL — ABNORMAL LOW (ref 4.22–5.81)
RDW: 14.5 % (ref 11.5–15.5)
WBC: 10.1 10*3/uL (ref 4.0–10.5)

## 2017-06-23 LAB — SAMPLE TO BLOOD BANK

## 2017-06-23 LAB — HIV ANTIBODY (ROUTINE TESTING W REFLEX): HIV SCREEN 4TH GENERATION: NONREACTIVE

## 2017-06-23 MED ORDER — LORAZEPAM BOLUS VIA INFUSION: 1.0000 mg | INTRAVENOUS | Status: DC | PRN

## 2017-06-23 MED ORDER — OXYCODONE-ACETAMINOPHEN 5-325 MG PO TABS: 1.0000 | ORAL_TABLET | ORAL | 0 refills | Status: DC | PRN

## 2017-06-23 MED ORDER — LORAZEPAM 2 MG/ML IJ SOLN: 1.0000 mg | INTRAMUSCULAR | Status: DC | PRN

## 2017-06-23 NOTE — Anesthesia Postprocedure Evaluation (Signed)
Anesthesia Post Note  Patient: Michael Hayden  Procedure(s) Performed: WOUND EXPLORATION RIGHT THIGH (Right Thigh)     Patient location during evaluation: PACU Anesthesia Type: General Level of consciousness: awake and alert Pain management: pain level controlled Vital Signs Assessment: post-procedure vital signs reviewed and stable Respiratory status: spontaneous breathing, nonlabored ventilation, respiratory function stable and patient connected to nasal cannula oxygen Cardiovascular status: blood pressure returned to baseline and stable Postop Assessment: no apparent nausea or vomiting Anesthetic complications: no    Last Vitals:  Vitals:   06/22/17 2300 06/23/17 0422  BP: 124/73 101/66  Pulse: 87 78  Resp: 13 15  Temp: (!) 36.4 C (!) 36.4 C  SpO2: 99% 99%    Last Pain:  Vitals:   06/23/17 0422  TempSrc: Oral  PainSc:                  Catheryn Baconyan P Ellender

## 2017-06-23 NOTE — Progress Notes (Signed)
Patient getting agitated and verbally abusive. Paged Dr. Edilia Boickson. Order received for Ativan 1mg  IV Q4 hours for agitation.
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
## 2017-06-24 ENCOUNTER — Telehealth: Payer: Self-pay | Admitting: Vascular Surgery
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
## 2017-06-24 NOTE — Telephone Encounter (Signed)
Sched appt 07/07/17 at 11:15. PH# did not have a vm and he has no alternate ph#s. Mailed appt letter through regular mail.

## 2017-06-25 ENCOUNTER — Encounter (INDEPENDENT_AMBULATORY_CARE_PROVIDER_SITE_OTHER): Payer: Self-pay | Admitting: Physician Assistant

## 2017-06-25 SURGERY — ABDOMINAL AORTOGRAM W/LOWER EXTREMITY
Anesthesia: LOCAL

## 2017-07-07 ENCOUNTER — Ambulatory Visit (INDEPENDENT_AMBULATORY_CARE_PROVIDER_SITE_OTHER): Payer: Self-pay | Admitting: Vascular Surgery

## 2017-07-07 ENCOUNTER — Encounter: Payer: Self-pay | Admitting: Vascular Surgery

## 2017-07-07 VITALS — BP 126/83 | HR 87 | Temp 97.7°F | Resp 16 | Ht 68.0 in | Wt 147.0 lb

## 2017-07-07 DIAGNOSIS — Z48812 Encounter for surgical aftercare following surgery on the circulatory system: Secondary | ICD-10-CM
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
## 2017-09-03 DIAGNOSIS — Z8669 Personal history of other diseases of the nervous system and sense organs: Secondary | ICD-10-CM

## 2017-11-08 ENCOUNTER — Encounter (HOSPITAL_COMMUNITY): Payer: Self-pay | Admitting: Emergency Medicine

## 2017-11-08 ENCOUNTER — Emergency Department (HOSPITAL_COMMUNITY)
Admission: EM | Admit: 2017-11-08 | Discharge: 2017-11-09 | Disposition: A | Discharge status: Home / Self Care | Payer: Self-pay | Attending: Emergency Medicine | Admitting: Emergency Medicine

## 2017-11-08 ENCOUNTER — Emergency Department (HOSPITAL_COMMUNITY): Payer: Self-pay

## 2017-11-08 DIAGNOSIS — Z79899 Other long term (current) drug therapy: Secondary | ICD-10-CM | POA: Insufficient documentation

## 2017-11-08 DIAGNOSIS — R079 Chest pain, unspecified: Secondary | ICD-10-CM

## 2017-11-08 DIAGNOSIS — F17210 Nicotine dependence, cigarettes, uncomplicated: Secondary | ICD-10-CM | POA: Insufficient documentation

## 2017-11-08 DIAGNOSIS — R0789 Other chest pain: Secondary | ICD-10-CM | POA: Insufficient documentation

## 2017-11-08 LAB — CBC
HEMATOCRIT: 43.9 % (ref 39.0–52.0)
Hemoglobin: 14.6 g/dL (ref 13.0–17.0)
MCH: 31.1 pg (ref 26.0–34.0)
MCHC: 33.3 g/dL (ref 30.0–36.0)
MCV: 93.4 fL (ref 78.0–100.0)
Platelets: 292 10*3/uL (ref 150–400)
RBC: 4.7 MIL/uL (ref 4.22–5.81)
RDW: 14.5 % (ref 11.5–15.5)
WBC: 6.5 10*3/uL (ref 4.0–10.5)

## 2017-11-08 LAB — BASIC METABOLIC PANEL
Anion gap: 11 (ref 5–15)
BUN: 7 mg/dL (ref 6–20)
CHLORIDE: 99 mmol/L — AB (ref 101–111)
CO2: 25 mmol/L (ref 22–32)
Calcium: 8.4 mg/dL — ABNORMAL LOW (ref 8.9–10.3)
Creatinine, Ser: 0.84 mg/dL (ref 0.61–1.24)
GFR calc Af Amer: >60 mL/min (ref >60)
GFR calc non Af Amer: >60 mL/min (ref >60)
GLUCOSE: 79 mg/dL (ref 65–99)
POTASSIUM: 3.8 mmol/L (ref 3.5–5.1)
Sodium: 135 mmol/L (ref 135–145)

## 2017-11-08 LAB — I-STAT TROPONIN, ED: Troponin i, poc: 0 ng/mL (ref 0.00–0.08)

## 2017-11-08 NOTE — ED Triage Notes (Signed)
Pt c/o left chest pain that started 1 hour PTA. Denies radiation or other associated symptoms. When asked to describe the pain, pt states "it just hurts".

## 2017-11-09 LAB — I-STAT TROPONIN, ED: Troponin i, poc: 0 ng/mL (ref 0.00–0.08)

## 2017-11-09 MED ORDER — NITROGLYCERIN 0.4 MG SL SUBL: 0.4000 mg | SUBLINGUAL_TABLET | SUBLINGUAL | Status: DC | PRN

## 2017-11-09 MED ORDER — ASPIRIN 81 MG PO CHEW
324.0000 mg | CHEWABLE_TABLET | Freq: Once | ORAL | Status: AC
Start: 2017-11-09 — End: 2017-11-09
  Administered 2017-11-09: 324 mg via ORAL
  Filled 2017-11-09: qty 4

## 2017-11-09 NOTE — Discharge Instructions (Addendum)
1. Medications: usual home medications 2. Treatment: rest, drink plenty of fluids,  3. Follow Up: Please followup with your primary and cardiology doctor in 2-3 days for discussion of your diagnoses and further evaluation after today's visit; if you do not have a primary care doctor use the resource guide provided to find one; Please return to the ER for chest pain that radiates or is associated with shortness of breath, sweating, passing out, vomiting or other concerns.
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
## 2017-11-09 NOTE — ED Notes (Signed)
Patient Alert and oriented to baseline. Stable and ambulatory to baseline. Patient verbalized understanding of the discharge instructions.  Patient belongings were taken by the patient.   

## 2017-12-13 DIAGNOSIS — R740 Nonspecific elevation of levels of transaminase and lactic acid dehydrogenase [LDH]: Secondary | ICD-10-CM
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
## 2018-04-07 ENCOUNTER — Emergency Department (HOSPITAL_COMMUNITY)
Admission: EM | Admit: 2018-04-07 | Discharge: 2018-04-07 | Disposition: A | Discharge status: Home / Self Care | Payer: Self-pay | Attending: Emergency Medicine | Admitting: Emergency Medicine

## 2018-04-07 ENCOUNTER — Other Ambulatory Visit: Payer: Self-pay

## 2018-04-07 ENCOUNTER — Encounter (HOSPITAL_COMMUNITY): Payer: Self-pay | Admitting: Emergency Medicine

## 2018-04-07 DIAGNOSIS — R569 Unspecified convulsions: Secondary | ICD-10-CM | POA: Insufficient documentation

## 2018-04-07 DIAGNOSIS — Z9114 Patient's other noncompliance with medication regimen: Secondary | ICD-10-CM | POA: Insufficient documentation

## 2018-04-07 DIAGNOSIS — F17210 Nicotine dependence, cigarettes, uncomplicated: Secondary | ICD-10-CM | POA: Insufficient documentation

## 2018-04-07 LAB — BASIC METABOLIC PANEL
ANION GAP: 13 (ref 5–15)
BUN: 7 mg/dL (ref 6–20)
CALCIUM: 9.3 mg/dL (ref 8.9–10.3)
CHLORIDE: 98 mmol/L (ref 98–111)
CO2: 25 mmol/L (ref 22–32)
CREATININE: 1.11 mg/dL (ref 0.61–1.24)
GFR calc non Af Amer: >60 mL/min (ref >60)
Glucose, Bld: 81 mg/dL (ref 70–99)
Potassium: 4.7 mmol/L (ref 3.5–5.1)
SODIUM: 136 mmol/L (ref 135–145)

## 2018-04-07 LAB — CBC
HEMATOCRIT: 41.4 % (ref 39.0–52.0)
HEMOGLOBIN: 13.8 g/dL (ref 13.0–17.0)
MCH: 30.7 pg (ref 26.0–34.0)
MCHC: 33.3 g/dL (ref 30.0–36.0)
MCV: 92.2 fL (ref 78.0–100.0)
Platelets: 235 10*3/uL (ref 150–400)
RBC: 4.49 MIL/uL (ref 4.22–5.81)
RDW: 14.6 % (ref 11.5–15.5)
WBC: 4.8 10*3/uL (ref 4.0–10.5)

## 2018-04-07 MED ORDER — LEVETIRACETAM 500 MG PO TABS: 500.0000 mg | ORAL_TABLET | Freq: Two times a day (BID) | ORAL | 3 refills | Status: DC

## 2018-04-07 MED ORDER — LEVETIRACETAM 500 MG PO TABS
1500.0000 mg | ORAL_TABLET | Freq: Once | ORAL | Status: AC
  Administered 2018-04-07: 1500 mg via ORAL
  Filled 2018-04-07: qty 3
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
## 2018-04-07 NOTE — ED Notes (Addendum)
Patient verbalizes understanding of discharge instructions. Opportunity for questioning and answers were provided. Armband removed by staff, pt discharged from ED ambulatory.   

## 2018-04-07 NOTE — Discharge Instructions (Addendum)
You were evaluated in the Emergency Department and after careful evaluation, we did not find any emergent condition requiring admission or further testing in the hospital.  Your symptoms today seem to be due to seizure.  It is very important that you take your seizure medicine twice a day every day to prevent seizures.  Please return to the Emergency Department if you experience any worsening of your condition.  We encourage you to follow up with a primary care provider.  Thank you for allowing us to be a part of your care.

## 2018-04-07 NOTE — Care Management Note (Signed)
Case Management Note  Patient Details  Name: Michael Hayden MRN: [id number] Date of Birth: 1976/05/13  Subjective/Objective:                  Seizure  Action/Plan: ED CM spoke with the patient. In[facility] the patient [facility] Discussed taking the prescription he has in hand to the pharmacy. Patient verbalizes understanding. Patient states he does not have a PCP. Patient [facility] and encouraged to call tomorrow to schedule an appointment. Patient agrees. Per previous notes in the chart the [facility]   Expected Discharge Date:   04/07/18               Expected Discharge Plan:     In-House Referral:     Discharge planning Services  Medication Assistance, Indigent Health Clinic  Post Acute Care Choice:    Choice offered to:     DME Arranged:    DME Agency:     HH Arranged:    HH Agency:     Status of Service:  Completed, signed off  If discussed at [employer] Meetings, dates discussed:    Additional Comments:  [name], [name], RN 04/07/2018, 6:33 PM
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
## 2018-06-10 ENCOUNTER — Ambulatory Visit: Payer: Self-pay | Attending: Nurse Practitioner | Admitting: Nurse Practitioner

## 2018-06-10 ENCOUNTER — Encounter: Payer: Self-pay | Admitting: Nurse Practitioner

## 2018-06-10 VITALS — BP 142/92 | HR 87 | Temp 98.9°F | Ht 68.0 in | Wt 146.0 lb

## 2018-06-10 DIAGNOSIS — R569 Unspecified convulsions: Secondary | ICD-10-CM | POA: Insufficient documentation

## 2018-06-10 DIAGNOSIS — Z9889 Other specified postprocedural states: Secondary | ICD-10-CM | POA: Insufficient documentation

## 2018-06-10 DIAGNOSIS — Z79899 Other long term (current) drug therapy: Secondary | ICD-10-CM | POA: Insufficient documentation

## 2018-06-10 DIAGNOSIS — Z87898 Personal history of other specified conditions: Secondary | ICD-10-CM

## 2018-06-10 NOTE — Patient Instructions (Signed)
Seizure, Adult °When you have a seizure: °· Parts of your body may move. °· How aware or awake (conscious) you are may change. °· You may shake (convulse). ° °Some people have symptoms right before a seizure happens. These symptoms may include: °· Fear. °· Worry (anxiety). °· Feeling like you are going to throw up (nausea). °· Feeling like the room is spinning (vertigo). °· Feeling like you saw or heard something before (deja vu). °· Odd tastes or smells. °· Changes in vision, such as seeing flashing lights or spots. ° °Seizures usually last from 30 seconds to 2 minutes. Usually, they are not harmful unless they last a long time. °Follow these instructions at home: °Medicines °· Take over-the-counter and prescription medicines only as told by your doctor. °· Avoid anything that may keep your medicine from working, such as alcohol. °Activity °· Do not do any activities that would be dangerous if you had another seizure, like driving or swimming. Wait until your doctor approves. °· If you live in the [place] ask your local DMV (department of motor vehicles) when you can drive. °· Rest. °Teaching others °· Teach friends and family what to do when you have a seizure. They should: °? Lay you on the ground. °? Protect your head and body. °? Loosen any tight clothing around your neck. °? Turn you on your side. °? Stay with you until you are better. °? Not hold you down. °? Not put anything in your mouth. °? Know whether or not you need emergency care. °General instructions °· Contact your doctor each time you have a seizure. °· Avoid anything that gives you seizures. °· Keep a seizure diary. Write down: °? What you think caused each seizure. °? What you remember about each seizure. °· Keep all follow-up visits as told by your doctor. This is important. °Contact a doctor if: °· You have another seizure. °· You have seizures more often. °· There is any change in what happens during your seizures. °· You continue to have  seizures with treatment. °· You have symptoms of being sick or having an infection. °Get help right away if: °· You have a seizure: °? That lasts longer than 5 minutes. °? That is different than seizures you had before. °? That makes it harder to breathe. °? After you hurt your head. °· After a seizure, you cannot speak or use a part of your body. °· After a seizure, you are confused or have a bad headache. °· You have two or more seizures in a row. °· You are having seizures more often. °· You do not wake up right after a seizure. °· You get hurt during a seizure. °In an emergency: °· These symptoms may be an emergency. Do not wait to see if the symptoms will go away. Get medical help right away. Call your local emergency services (911 in the [place] Do not drive yourself to the hospital. °This information is not intended to replace advice given to you by your health care provider. Make sure you discuss any questions you have with your health care provider. °Document Released: 01/13/2008 Document Revised: 04/08/2016 Document Reviewed: 04/08/2016 °Elsevier Interactive Patient Education © 2017 Elsevier Inc. ° °

## 2018-06-10 NOTE — Progress Notes (Signed)
Assessment & Plan:  Michael Hayden was seen today for hospitalization follow-up.  Diagnoses and all orders for this visit:  History of seizure -     levETIRAcetam (KEPPRA) 500 MG tablet; Take 1 tablet (500 mg total) by mouth 2 (two) times daily.    Patient has been counseled on age-appropriate routine health concerns for screening and prevention. These are reviewed and up-to-date. Referrals have been placed accordingly. Immunizations are up-to-date or declined.    Subjective:   Chief Complaint  Patient presents with  . Hospitalization Follow-up    Pt. is here for a hospital follow-up.    HPI Michael Hayden 42 y.o. male presents to office today to follow up for seizures.    Seizures He has a history of tonic-clonic seizures. He has not taken keppra for the past several months. He has never seen a neurologist and needs to be evaluated however he does not have insurance. He has utilized the ED in the past for medication refills and treatment of his seizures. He also has a history of recurrent alcohol abuse. He states he has "cut back" on his alcohol use. Denies any seizure activity since his last ED visit in August 2019. Patient has been advised to apply for financial assistance and schedule to see our financial counselor. He states he can not afford to pya for his medications.        Review of Systems  Constitutional: Negative for fever, malaise/fatigue and weight loss.  HENT: Negative.  Negative for nosebleeds.   Eyes: Negative.  Negative for blurred vision, double vision and photophobia.  Respiratory: Negative.  Negative for cough and shortness of breath.   Cardiovascular: Negative.  Negative for chest pain, palpitations and leg swelling.  Gastrointestinal: Negative.  Negative for heartburn, nausea and vomiting.  Musculoskeletal: Negative.  Negative for myalgias.  Neurological: Negative.  Negative for dizziness, focal weakness, seizures and headaches.  Psychiatric/Behavioral:  Positive for substance abuse (alcohol). Negative for depression, hallucinations, memory loss and suicidal ideas. The patient is not nervous/anxious and does not have insomnia.     Past Medical History:  Diagnosis Date  . Seizures (HCC)    doesn't know age of onset, pt reports worse this year    Past Surgical History:  Procedure Laterality Date  . WOUND EXPLORATION Right 06/22/2017   Procedure: WOUND EXPLORATION RIGHT THIGH;  Surgeon: Chuck Hint, MD;  Location: [facility] OR;  Service: Vascular;  Laterality: Right;    History reviewed. No pertinent family history.  Social History Reviewed with no changes to be made today.   Outpatient Medications Prior to Visit  Medication Sig Dispense Refill  . acetaminophen (TYLENOL) 500 MG tablet Take 500-1,000 mg by mouth every 6 (six) hours as needed (for headaches or pain).    Marland Kitchen ibuprofen (ADVIL,MOTRIN) 200 MG tablet Take 200-400 mg by mouth every 8 (eight) hours as needed (for pain or headaches).    . Naphazoline-Pheniramine (OPCON-A) 0.027-0.315 % SOLN Place 1-2 drops into both eyes 3 (three) times daily as needed (for itching).    . naproxen sodium (ALEVE) 220 MG tablet Take 220-440 mg by mouth 2 (two) times daily as needed.    Marland Kitchen oxyCODONE-acetaminophen (PERCOCET/ROXICET) 5-325 MG tablet Take 1 tablet every 4 (four) hours as needed by mouth for moderate pain. (Patient not taking: Reported on 04/07/2018) 20 tablet 0  . levETIRAcetam (KEPPRA) 500 MG tablet Take 1 tablet (500 mg total) by mouth 2 (two) times daily. (Patient not taking: Reported on 04/07/2018) 30 tablet  3  . levETIRAcetam (KEPPRA) 500 MG tablet Take 1 tablet (500 mg total) by mouth 2 (two) times daily. (Patient not taking: Reported on 06/10/2018) 30 tablet 3   No facility-administered medications prior to visit.     No Known Allergies     Objective:    BP (!) 142/92 (BP Location: Left Arm, Patient Position: Sitting, Cuff Size: Normal)   Pulse 87   Temp 98.9 F (37.2 C)  (Oral)   Ht 5\' 8"  (1.727 m)   Wt 146 lb (66.2 kg)   SpO2 97%   BMI 22.20 kg/m  Wt Readings from Last 3 Encounters:  06/10/18 146 lb (66.2 kg)  04/07/18 149 lb 14.6 oz (68 kg)  11/08/17 150 lb (68 kg)    Physical Exam  Constitutional: He is oriented to person, place, and time. He appears well-developed and well-nourished. He is cooperative.  HENT:  Head: Normocephalic and atraumatic.  Eyes: EOM are normal.  Neck: Normal range of motion.  Cardiovascular: Normal rate, regular rhythm and normal heart sounds. Exam reveals no gallop and no friction rub.  No murmur heard. Pulmonary/Chest: Effort normal and breath sounds normal. No tachypnea. No respiratory distress. He has no decreased breath sounds. He has no wheezes. He has no rhonchi. He has no rales. He exhibits no tenderness.  Abdominal: Bowel sounds are normal.  Musculoskeletal: Normal range of motion. He exhibits no edema.  Neurological: He is alert and oriented to person, place, and time. Coordination normal.  Skin: Skin is warm and dry.  Psychiatric: He has a normal mood and affect. His behavior is normal. Judgment and thought content normal.  Nursing note and vitals reviewed.      Patient has been counseled extensively about nutrition and exercise as well as the importance of adherence with medications and regular follow-up. The patient was given clear instructions to go to ER or return to medical center if symptoms don't improve, worsen or new problems develop. The patient verbalized understanding.   Follow-up: Return in about 3 weeks (around 07/01/2018) for physical and labs.   Claiborne Rigg, FNP-BC [facility] [place], [place] [phone]   06/12/2018, 3:44 PM

## 2018-06-12 ENCOUNTER — Encounter: Payer: Self-pay | Admitting: Nurse Practitioner

## 2018-06-12 MED ORDER — LEVETIRACETAM 500 MG PO TABS: 500.0000 mg | ORAL_TABLET | Freq: Two times a day (BID) | ORAL | 3 refills | Status: DC

## 2018-07-04 ENCOUNTER — Ambulatory Visit: Payer: Self-pay | Attending: Nurse Practitioner | Admitting: Nurse Practitioner

## 2018-07-04 ENCOUNTER — Encounter: Payer: Self-pay | Admitting: Nurse Practitioner

## 2018-07-04 VITALS — BP 149/99 | HR 90 | Temp 98.7°F | Ht 68.0 in | Wt 147.0 lb

## 2018-07-04 DIAGNOSIS — R569 Unspecified convulsions: Secondary | ICD-10-CM | POA: Insufficient documentation

## 2018-07-04 DIAGNOSIS — R1907 Generalized intra-abdominal and pelvic swelling, mass and lump: Secondary | ICD-10-CM

## 2018-07-04 DIAGNOSIS — F17200 Nicotine dependence, unspecified, uncomplicated: Secondary | ICD-10-CM

## 2018-07-04 DIAGNOSIS — I10 Essential (primary) hypertension: Secondary | ICD-10-CM | POA: Insufficient documentation

## 2018-07-04 DIAGNOSIS — Z0001 Encounter for general adult medical examination with abnormal findings: Secondary | ICD-10-CM

## 2018-07-04 DIAGNOSIS — Z791 Long term (current) use of non-steroidal anti-inflammatories (NSAID): Secondary | ICD-10-CM | POA: Insufficient documentation

## 2018-07-04 DIAGNOSIS — Z0000 Encounter for general adult medical examination without abnormal findings: Secondary | ICD-10-CM | POA: Insufficient documentation

## 2018-07-04 DIAGNOSIS — F17210 Nicotine dependence, cigarettes, uncomplicated: Secondary | ICD-10-CM | POA: Insufficient documentation

## 2018-07-04 DIAGNOSIS — Z79899 Other long term (current) drug therapy: Secondary | ICD-10-CM | POA: Insufficient documentation

## 2018-07-04 DIAGNOSIS — R229 Localized swelling, mass and lump, unspecified: Secondary | ICD-10-CM | POA: Insufficient documentation

## 2018-07-04 MED ORDER — AMLODIPINE BESYLATE 5 MG PO TABS: 5.0000 mg | ORAL_TABLET | Freq: Every day | ORAL | 3 refills | Status: DC

## 2018-07-04 MED FILL — AMLODIPINE BESYLATE 5 MG TA: 5 | 30 days supply | Qty: 30 | Fill #0

## 2018-07-04 MED FILL — levETIRAcetam 500 MG TABS: 500 | 15 days supply | Qty: 30 | Fill #0

## 2018-07-04 NOTE — Patient Instructions (Signed)

## 2018-07-04 NOTE — Progress Notes (Signed)
Assessment & Plan:  Michael Hayden was seen today for annual exam.  Diagnoses and all orders for this visit:  Encounter for routine history and physical exam for male Abnormal exam/abnormal skin lesion. Needs to see dermatology for skin mass. Patient has been advised multiple times to apply for financial assistance and schedule to see our financial counselor.   Tobacco dependence Michael [facility]den was counseled on the dangers of tobacco use, and was advised to quit. Reviewed strategies to maximize success, including removing cigarettes and smoking materials from environment, stress management and support of family/friends as well as pharmacological alternatives including: Wellbutrin, Chantix, Nicotine patch, Nicotine gum or lozenges. Smoking cessation support: smoking cessation hotline: 1-800-QUIT-NOW.  Smoking cessation classes are also available through [facility]. Call [phone] or visit our website at [email or web address].   A total of  3 minutes was spent on counseling for smoking cessation and Michael Hayden is not ready to quit.   ESSENTIAL HYPERTENSION BP Readings from Last 3 Encounters:  07/04/18 (!) 149/99  06/10/18 (!) 142/92  04/07/18 124/87  Blood pressure not well controlled. He denies any previous history of HTN.Will start amlodipine 5 mg today and have him return in 3-4 weeks for BP recheck.  Denies chest pain, shortness of breath, palpitations, lightheadedness, dizziness, headaches or BLE edema.    Patient has been counseled on age-appropriate routine health concerns for screening and prevention. These are reviewed and up-to-date. Referrals have been placed accordingly. Immunizations are up-to-date or declined.    Subjective:   Chief Complaint  Patient presents with  . Annual Exam    Pt. is here for a physical.    HPI Michael Hayden 42 y.o. male presents to office today   Review of Systems  Constitutional: Negative for fever, malaise/fatigue and weight loss.   HENT: Negative.  Negative for nosebleeds.   Eyes: Negative.  Negative for blurred vision, double vision and photophobia.  Respiratory: Negative.  Negative for cough and shortness of breath.   Cardiovascular: Negative.  Negative for chest pain, palpitations and leg swelling.  Gastrointestinal: Negative.  Negative for heartburn, nausea and vomiting.  Genitourinary: Negative.   Musculoskeletal: Negative.  Negative for myalgias.  Skin: Negative.   Neurological: Positive for tremors (bilateral hands) and seizures (denies any recent seizure activity despite not picking up his keppra last month). Negative for dizziness, focal weakness and headaches.  Endo/Heme/Allergies: Negative.   Psychiatric/Behavioral: Positive for substance abuse (etoh abuse. last alcohol use 3 days ago. ). Negative for suicidal ideas.    Past Medical History:  Diagnosis Date  . Seizures (HCC)    doesn't know age of onset, pt reports worse this year    Past Surgical History:  Procedure Laterality Date  . WOUND EXPLORATION Right 06/22/2017   Procedure: WOUND EXPLORATION RIGHT THIGH;  Surgeon: Chuck Hint, MD;  Location[facility];  Service: Vascular;  Laterality: Right;    History reviewed. No pertinent family history.  Social History Reviewed with no changes to be made today.   Outpatient Medications Prior to Visit  Medication Sig Dispense Refill  . levETIRAcetam (KEPPRA) 500 MG tablet Take 1 tablet (500 mg total) by mouth 2 (two) times daily. 30 tablet 3  . acetaminophen (TYLENOL) 500 MG tablet Take 500-1,000 mg by mouth every 6 (six) hours as needed (for headaches or pain).    Marland Kitchen ibuprofen (ADVIL,MOTRIN) 200 MG tablet Take 200-400 mg by mouth every 8 (eight) hours as needed (for pain or headaches).    March Rummage (  OPCON-A) 0.027-0.315 % SOLN Place 1-2 drops into both eyes 3 (three) times daily as needed (for itching).    . naproxen sodium (ALEVE) 220 MG tablet Take 220-440 mg by mouth 2 (two)  times daily as needed.    Marland Kitchen oxyCODONE-acetaminophen (PERCOCET/ROXICET) 5-325 MG tablet Take 1 tablet every 4 (four) hours as needed by mouth for moderate pain. (Patient not taking: Reported on 04/07/2018) 20 tablet 0   No facility-administered medications prior to visit.     No Known Allergies     Objective:    BP (!) 157/104 (BP Location: Left Arm, Patient Position: Sitting, Cuff Size: Normal)   Pulse 90   Temp 98.7 F (37.1 C) (Oral)   Ht 5\' 8"  (1.727 m)   Wt 147 lb (66.7 kg)   SpO2 99%   BMI 22.35 kg/m  Wt Readings from Last 3 Encounters:  07/04/18 147 lb (66.7 kg)  06/10/18 146 lb (66.2 kg)  04/07/18 149 lb 14.6 oz (68 kg)    Physical Exam  Constitutional: He is oriented to person, place, and time. He appears well-developed and well-nourished.  HENT:  Head: Normocephalic and atraumatic.  Right Ear: Hearing and external ear normal.  Left Ear: Hearing and external ear normal.  Nose: Nose normal. No mucosal edema or rhinorrhea.  Mouth/Throat: Uvula is midline, oropharynx is clear and moist and mucous membranes are normal. Tonsils are 1+ on the right. Tonsils are 1+ on the left. No tonsillar exudate.  Moderate amount of cerumen preventing full visualization of bilateral TM  Eyes: Pupils are equal, round, and reactive to light. Conjunctivae, EOM and lids are normal. No scleral icterus.  Neck: Normal range of motion. Neck supple. No tracheal deviation present. No thyromegaly present.  Cardiovascular: Normal rate, regular rhythm, normal heart sounds and intact distal pulses. Exam reveals no gallop and no friction rub.  No murmur heard. Pulses:      Dorsalis pedis pulses are 2+ on the right side, and 2+ on the left side.       Posterior tibial pulses are 2+ on the right side, and 2+ on the left side.  Pulmonary/Chest: Effort normal and breath sounds normal. No respiratory distress. He has no wheezes. He has no rales. He exhibits no mass and no tenderness. Right breast exhibits no  inverted nipple, no mass, no nipple discharge, no skin change and no tenderness. Left breast exhibits no inverted nipple, no mass, no nipple discharge, no skin change and no tenderness.  Abdominal: Soft. Bowel sounds are normal. He exhibits no distension and no mass. There is no tenderness. There is no rebound and no guarding. Hernia confirmed negative in the right inguinal area and confirmed negative in the left inguinal area.    Genitourinary: Testes normal and penis normal. Right testis shows no mass, no swelling and no tenderness. Right testis is descended. Cremasteric reflex is not absent on the right side. Left testis shows no mass, no swelling and no tenderness. Left testis is descended. Cremasteric reflex is not absent on the left side.  Musculoskeletal: Normal range of motion. He exhibits no edema, tenderness or deformity.  Feet:  Right Foot:  Protective Sensation: 10 sites tested. 10 sites sensed.  Skin Integrity: Positive for dry skin. Negative for skin breakdown.  Left Foot:  Protective Sensation: 10 sites tested. 10 sites sensed.  Skin Integrity: Positive for dry skin. Negative for skin breakdown.  Lymphadenopathy:    He has no cervical adenopathy. No inguinal adenopathy noted on the right or  left side.       Right: No inguinal adenopathy present.       Left: No inguinal adenopathy present.  Neurological: He is alert and oriented to person, place, and time. He displays tremor. He displays normal reflexes. No cranial nerve deficit or sensory deficit. He exhibits normal muscle tone. He displays no seizure activity. Coordination and gait normal.  Reflex Scores:      Patellar reflexes are 2+ on the right side and 2+ on the left side. Skin: Skin is warm and dry. Capillary refill takes less than 2 seconds. No erythema.  Psychiatric: He has a normal mood and affect. His behavior is normal. Judgment and thought content normal.       Patient has been counseled extensively about nutrition  and exercise as well as the importance of adherence with medications and regular follow-up. The patient was given clear instructions to go to ER or return to medical center if symptoms don't improve, worsen or new problems develop. The patient verbalized understanding.   Follow-up: No follow-ups on file.   Claiborne RiggZelda W Fleming, FNP-BC [facility] [place], [place] [phone]   07/04/2018, 4:10 PM

## 2018-07-26 ENCOUNTER — Other Ambulatory Visit: Payer: Self-pay

## 2018-07-26 ENCOUNTER — Encounter: Payer: Self-pay | Admitting: Nurse Practitioner

## 2018-07-26 ENCOUNTER — Ambulatory Visit: Payer: Self-pay | Attending: Nurse Practitioner | Admitting: Nurse Practitioner

## 2018-07-26 VITALS — BP 131/82 | HR 99 | Temp 99.3°F | Ht 68.0 in | Wt 148.0 lb

## 2018-07-26 DIAGNOSIS — F17200 Nicotine dependence, unspecified, uncomplicated: Secondary | ICD-10-CM

## 2018-07-26 DIAGNOSIS — Z79899 Other long term (current) drug therapy: Secondary | ICD-10-CM | POA: Insufficient documentation

## 2018-07-26 DIAGNOSIS — I10 Essential (primary) hypertension: Secondary | ICD-10-CM

## 2018-07-26 DIAGNOSIS — F17210 Nicotine dependence, cigarettes, uncomplicated: Secondary | ICD-10-CM

## 2018-07-26 NOTE — Progress Notes (Signed)
Assessment & Plan:  Michael Hayden was seen today for hypertension.  Diagnoses and all orders for this visit:  Essential hypertension Continue all antihypertensives as prescribed.  Remember to bring in your blood pressure log with you for your follow up appointment.  DASH/Mediterranean Diets are healthier choices for HTN.    Tobacco dependence Michael Hayden was counseled on the dangers of tobacco use, and was advised to quit. Reviewed strategies to maximize success, including removing cigarettes and smoking materials from environment, stress management and support of family/friends as well as pharmacological alternatives including: Wellbutrin, Chantix, Nicotine patch, Nicotine gum or lozenges. Smoking cessation support: smoking cessation hotline: 1-800-QUIT-NOW.  Smoking cessation classes are also available through [facility] and [facility]. Call 2198423579 or visit our website at [email or web address].   A total of 3 minutes was spent on counseling for smoking cessation and Michael Hayden is not ready to quit.     Patient has been counseled on age-appropriate routine health concerns for screening and prevention. These are reviewed and up-to-date. Referrals have been placed accordingly. Immunizations are up-to-date or declined.    Subjective:   Chief Complaint  Patient presents with  . Hypertension    recheck    HPI Michael Hayden 42 y.o. male presents to office today for follow up to HTN. He has been advised several times to schedule with the financial counselor and today reports he still has not filled out the paperwork or made an appointment. He has not followed up with Neurology for seizures due to financial issues. Denies any recent seizure activity and endorses medication compliance taking keppra 500mg  BID.   Essential Hypertension Currently taking amlodipine 5mg  daily as prescribed. Blood pressure is well controlled today. He is not diet or exercise compliant. He continues to drink  socially. Currently denies chest pain, shortness of breath, palpitations, lightheadedness, dizziness, headaches or BLE edema.  BP Readings from Last 3 Encounters:  07/26/18 131/82  07/04/18 (!) 149/99  06/10/18 (!) 142/92      Review of Systems  Constitutional: Negative for fever, malaise/fatigue and weight loss.  HENT: Negative.  Negative for nosebleeds.   Eyes: Negative.  Negative for blurred vision, double vision and photophobia.  Respiratory: Negative.  Negative for cough and shortness of breath.   Cardiovascular: Negative.  Negative for chest pain, palpitations and leg swelling.  Gastrointestinal: Negative.  Negative for heartburn, nausea and vomiting.  Musculoskeletal: Negative.  Negative for myalgias.  Neurological: Negative.  Negative for dizziness, focal weakness, seizures and headaches.  Psychiatric/Behavioral: Negative.  Negative for suicidal ideas.    Past Medical History:  Diagnosis Date  . Seizures (HCC)    doesn't know age of onset, pt reports worse this year    Past Surgical History:  Procedure Laterality Date  . WOUND EXPLORATION Right 06/22/2017   Procedure: WOUND EXPLORATION RIGHT THIGH;  Surgeon: Chuck Hint, MD;  Location: [facility] OR;  Service: Vascular;  Laterality: Right;    History reviewed. No pertinent family history.  Social History Reviewed with no changes to be made today.   Outpatient Medications Prior to Visit  Medication Sig Dispense Refill  . amLODipine (NORVASC) 5 MG tablet Take 1 tablet (5 mg total) by mouth daily. 90 tablet 3  . levETIRAcetam (KEPPRA) 500 MG tablet Take 1 tablet (500 mg total) by mouth 2 (two) times daily. 30 tablet 3  . acetaminophen (TYLENOL) 500 MG tablet Take 500-1,000 mg by mouth every 6 (six) hours as needed (for headaches or pain).    Marland Kitchen  ibuprofen (ADVIL,MOTRIN) 200 MG tablet Take 200-400 mg by mouth every 8 (eight) hours as needed (for pain or headaches).    . Naphazoline-Pheniramine (OPCON-A) 0.027-0.315 %  SOLN Place 1-2 drops into both eyes 3 (three) times daily as needed (for itching).    . naproxen sodium (ALEVE) 220 MG tablet Take 220-440 mg by mouth 2 (two) times daily as needed.     No facility-administered medications prior to visit.     No Known Allergies     Objective:    BP 131/82   Pulse 99   Temp 99.3 F (37.4 C) (Oral)   Ht 5\' 8"  (1.727 m)   Wt 148 lb (67.1 kg)   SpO2 98%   BMI 22.50 kg/m  Wt Readings from Last 3 Encounters:  07/26/18 148 lb (67.1 kg)  07/04/18 147 lb (66.7 kg)  06/10/18 146 lb (66.2 kg)    Physical Exam Vitals signs and nursing note reviewed.  Constitutional:      Appearance: He is well-developed.  HENT:     Head: Normocephalic and atraumatic.  Neck:     Musculoskeletal: Normal range of motion.  Cardiovascular:     Rate and Rhythm: Normal rate and regular rhythm.     Heart sounds: Normal heart sounds. No murmur. No friction rub. No gallop.   Pulmonary:     Effort: Pulmonary effort is normal. No tachypnea or respiratory distress.     Breath sounds: Normal breath sounds. No decreased breath sounds, wheezing, rhonchi or rales.  Chest:     Chest wall: No tenderness.  Abdominal:     General: Bowel sounds are normal.     Palpations: Abdomen is soft.  Musculoskeletal: Normal range of motion.  Skin:    General: Skin is warm and dry.  Neurological:     Mental Status: He is alert and oriented to person, place, and time.     Coordination: Coordination normal.  Psychiatric:        Behavior: Behavior normal. Behavior is cooperative.        Thought Content: Thought content normal.        Judgment: Judgment normal.          Patient has been counseled extensively about nutrition and exercise as well as the importance of adherence with medications and regular follow-up. The patient was given clear instructions to go to ER or return to medical center if symptoms don't improve, worsen or new problems develop. The patient verbalized understanding.    Follow-up: Return in about 3 months (around 10/25/2018) for HTN.   Claiborne RiggZelda W Fleming, FNP-BC [facility] [place], [place] [phone]   07/26/2018, 3:06 PM

## 2018-10-25 ENCOUNTER — Ambulatory Visit: Payer: Self-pay | Admitting: Nurse Practitioner

## 2018-12-13 ENCOUNTER — Emergency Department (HOSPITAL_COMMUNITY)
Admission: EM | Admit: 2018-12-13 | Discharge: 2018-12-14 | Disposition: A | Discharge status: Home / Self Care | Payer: Self-pay | Attending: Emergency Medicine | Admitting: Emergency Medicine

## 2018-12-13 ENCOUNTER — Other Ambulatory Visit: Payer: Self-pay

## 2018-12-13 ENCOUNTER — Encounter (HOSPITAL_COMMUNITY): Payer: Self-pay | Admitting: Emergency Medicine

## 2018-12-13 DIAGNOSIS — Z79899 Other long term (current) drug therapy: Secondary | ICD-10-CM | POA: Insufficient documentation

## 2018-12-13 DIAGNOSIS — G40909 Epilepsy, unspecified, not intractable, without status epilepticus: Secondary | ICD-10-CM | POA: Insufficient documentation

## 2018-12-13 DIAGNOSIS — F17210 Nicotine dependence, cigarettes, uncomplicated: Secondary | ICD-10-CM | POA: Insufficient documentation

## 2018-12-13 MED ORDER — LEVETIRACETAM 500 MG PO TABS
1500.0000 mg | ORAL_TABLET | Freq: Once | ORAL | Status: AC
  Administered 2018-12-13: 1500 mg via ORAL
  Filled 2018-12-13: qty 3

## 2018-12-13 NOTE — ED Triage Notes (Signed)
Patient arrived with EMS from home c/o witnessed seizure. Per EMS witnesses stated seizure lasted 3-4 minutes. Patient stated he run out of seizure medication and unable to take for a month. Pt a/o x4 at this time.

## 2018-12-13 NOTE — ED Notes (Signed)
Bed: WA15 Expected date:  Expected time:  Means of arrival:  Comments: EMS Seizure? 
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
## 2019-06-21 ENCOUNTER — Other Ambulatory Visit: Payer: Self-pay | Admitting: Nurse Practitioner

## 2019-06-21 DIAGNOSIS — Z87898 Personal history of other specified conditions: Secondary | ICD-10-CM

## 2019-06-21 MED FILL — levETIRAcetam 500 MG TABS: 500 | 15 days supply | Qty: 30 | Fill #0

## 2019-06-21 MED FILL — AMLODIPINE BESYLATE 5 MG TA: 5 | 30 days supply | Qty: 30 | Fill #1

## 2019-07-24 ENCOUNTER — Other Ambulatory Visit: Payer: Self-pay | Admitting: Nurse Practitioner

## 2019-07-24 ENCOUNTER — Ambulatory Visit: Payer: Self-pay | Attending: Nurse Practitioner | Admitting: Nurse Practitioner

## 2019-07-24 ENCOUNTER — Other Ambulatory Visit: Payer: Self-pay

## 2019-07-24 ENCOUNTER — Encounter: Payer: Self-pay | Admitting: Nurse Practitioner

## 2019-07-24 DIAGNOSIS — Z87898 Personal history of other specified conditions: Secondary | ICD-10-CM

## 2019-07-24 DIAGNOSIS — I10 Essential (primary) hypertension: Secondary | ICD-10-CM

## 2019-07-24 DIAGNOSIS — G40409 Other generalized epilepsy and epileptic syndromes, not intractable, without status epilepticus: Secondary | ICD-10-CM

## 2019-07-24 DIAGNOSIS — F17200 Nicotine dependence, unspecified, uncomplicated: Secondary | ICD-10-CM

## 2019-07-24 DIAGNOSIS — F17210 Nicotine dependence, cigarettes, uncomplicated: Secondary | ICD-10-CM

## 2019-07-24 MED ORDER — AMLODIPINE BESYLATE 5 MG PO TABS: 5.0000 mg | ORAL_TABLET | Freq: Every day | ORAL | 1 refills | Status: DC

## 2019-07-24 MED ORDER — LEVETIRACETAM 500 MG PO TABS: 500.0000 mg | ORAL_TABLET | Freq: Two times a day (BID) | ORAL | 2 refills | Status: DC

## 2019-07-24 NOTE — Progress Notes (Signed)
Virtual Visit via Telephone Note Due to national recommendations of social distancing due to COVID 19, telehealth visit is felt to be most appropriate for this patient at this time.  I discussed the limitations, risks, security and privacy concerns of performing an evaluation and management service by telephone and the availability of in person appointments. I also discussed with the patient that there may be a patient responsible charge related to this service. The patient expressed understanding and agreed to proceed.    I connected with [name] on 07/24/19  at   3:50 PM EST  EDT by telephone and verified that I am speaking with the correct person using two identifiers.   Consent I discussed the limitations, risks, security and privacy concerns of performing an evaluation and management service by telephone and the availability of in person appointments. I also discussed with the patient that there may be a patient responsible charge related to this service. The patient expressed understanding and agreed to proceed.   Location of Patient: Private Residence   Location of Provider: [facility] [employer] [facility]    Persons participating in Telemedicine visit: Bertram Denver FNP-BC YY [place] CMA [name]    History of Present Illness: Telemedicine visit for: F/U. I have not seen or evaluated Mr. Oliveto in over a year. He was seen in the ED 12-2018 with seizure d/o. He has a history of Tonic-clonic seizures on Keppra. Also history of non adherence with taking keppra and has alcohol abuse d/o.   Has has not completely stopped drinking alcohol and  states "I have slowed down with my drinking". Has not had any alcohol in over 3-4 weeks. Denies any recent seizure activity.  Does not monitor his blood pressure as he does not have a device. Ran out of amlodipine 5mg  and Keppra 500 mg BID. Will refill both today. Denies chest pain, shortness of breath, palpitations,  lightheadedness, dizziness, headaches or BLE edema.  BP Readings from Last 3 Encounters:  12/14/18 (!) 144/100  07/26/18 131/82  07/04/18 (!) 149/99    Past Medical History:  Diagnosis Date  . Seizures (HCC)    doesn't know age of onset, pt reports worse this year    Past Surgical History:  Procedure Laterality Date  . WOUND EXPLORATION Right 06/22/2017   Procedure: WOUND EXPLORATION RIGHT THIGH;  Surgeon: 06/24/2017, MD;  Location: [facility] OR;  Service: Vascular;  Laterality: Right;    History reviewed. No pertinent family history.  Social History   Socioeconomic History  . Marital status: Single    Spouse name: Not on file  . Number of children: Not on file  . Years of education: Not on file  . Highest education level: Not on file  Occupational History  . Not on file  Tobacco Use  . Smoking status: Current Every Day Smoker    Packs/day: 1.00    Types: Cigarettes  . Smokeless tobacco: Never Used  . Tobacco comment: 1/2-1 pk per day  Substance and Sexual Activity  . Alcohol use: Not Currently    Alcohol/week: 4.0 standard drinks    Types: 4 Cans of beer per week  . Drug use: No  . Sexual activity: Yes    Partners: Female    Birth control/protection: None  Other Topics Concern  . Not on file  Social History Narrative   ** Merged History Encounter **       Social Determinants of Health   Financial Resource Strain:   .  Difficulty of Paying Living Expenses: Not on file  Food Insecurity:   . Worried About Charity fundraiser in the Last Year: Not on file  . Ran Out of Food in the Last Year: Not on file  Transportation Needs:   . Lack of Transportation (Medical): Not on file  . Lack of Transportation (Non-Medical): Not on file  Physical Activity:   . Days of Exercise per Week: Not on file  . Minutes of Exercise per Session: Not on file  Stress:   . Feeling of Stress : Not on file  Social Connections:   . Frequency of Communication with Friends and  Family: Not on file  . Frequency of Social Gatherings with Friends and Family: Not on file  . Attends Religious Services: Not on file  . Active Member of Clubs or Organizations: Not on file  . Attends Archivist Meetings: Not on file  . Marital Status: Not on file     Observations/Objective: Awake, alert and oriented x 3   Review of Systems  Constitutional: Negative for fever, malaise/fatigue and weight loss.  HENT: Negative.  Negative for nosebleeds.   Eyes: Negative.  Negative for blurred vision, double vision and photophobia.  Respiratory: Negative.  Negative for cough and shortness of breath.   Cardiovascular: Negative.  Negative for chest pain, palpitations and leg swelling.  Gastrointestinal: Negative.  Negative for heartburn, nausea and vomiting.  Musculoskeletal: Negative.  Negative for myalgias.  Neurological: Negative for dizziness, focal weakness, seizures and headaches.  Psychiatric/Behavioral: Positive for substance abuse. Negative for suicidal ideas.    Assessment and Plan: Leston was seen today for medication refill.  Diagnoses and all orders for this visit:  Tonic clonic seizures ([place]) -     levETIRAcetam (KEPPRA) 500 MG tablet; Take 1 tablet (500 mg total) by mouth 2 (two) times daily. Refrain from ETOH use. Take keppra daily as prescribed.  Essential hypertension -     amLODipine (NORVASC) 5 MG tablet; Take 1 tablet (5 mg total) by mouth daily. Continue all antihypertensives as prescribed.  Remember to bring in your blood pressure log with you for your follow up appointment.  DASH/Mediterranean Diets are healthier choices for HTN.   Tobacco dependence Shloime was counseled on the dangers of tobacco use, and was advised to quit. Reviewed strategies to maximize success, including removing cigarettes and smoking materials from environment, stress management and support of family/friends as well as pharmacological alternatives including: Wellbutrin,  Chantix, Nicotine patch, Nicotine gum or lozenges. Smoking cessation support: smoking cessation hotline: 1-800-QUIT-NOW.  Smoking cessation classes are also available through [facility] and [facility] Center. Call 3230798438 or visit our website at [email or web address]   A total of 3 minutes was spent on counseling for smoking cessation and Dickey is not ready to quit.    Follow Up Instructions Return in about 3 months (around 10/22/2019).     I discussed the assessment and treatment plan with the patient. The patient was provided an opportunity to ask questions and all were answered. The patient agreed with the plan and demonstrated an understanding of the instructions.   The patient was advised to call back or seek an in-person evaluation if the symptoms worsen or if the condition fails to improve as anticipated.  I provided 14 minutes of non-face-to-face time during this encounter including median intraservice time, reviewing previous notes, labs, imaging, medications and explaining diagnosis and management.  Gildardo Pounds, FNP-BC

## 2019-07-25 MED FILL — levETIRAcetam 500 MG TABS: 500 | 30 days supply | Qty: 60 | Fill #0

## 2019-07-25 MED FILL — AMLODIPINE BESYLATE 5 MG TA: 5 | 30 days supply | Qty: 30 | Fill #0

## 2019-08-01 ENCOUNTER — Other Ambulatory Visit: Payer: Self-pay

## 2019-08-01 ENCOUNTER — Ambulatory Visit: Payer: Self-pay | Attending: Nurse Practitioner

## 2019-08-01 DIAGNOSIS — I10 Essential (primary) hypertension: Secondary | ICD-10-CM

## 2019-08-02 LAB — CBC
Hematocrit: 45 % (ref 37.5–51.0)
Hemoglobin: 15.2 g/dL (ref 13.0–17.7)
MCH: 32.8 pg (ref 26.6–33.0)
MCHC: 33.8 g/dL (ref 31.5–35.7)
MCV: 97 fL (ref 79–97)
Platelets: 306 10*3/uL (ref 150–450)
RBC: 4.64 x10E6/uL (ref 4.14–5.80)
RDW: 13 % (ref 11.6–15.4)
WBC: 9 10*3/uL (ref 3.4–10.8)

## 2019-08-02 LAB — CMP14+EGFR
ALT: 19 IU/L (ref 0–44)
AST: 25 IU/L (ref 0–40)
Albumin/Globulin Ratio: 1.5 (ref 1.2–2.2)
Albumin: 4.3 g/dL (ref 4.0–5.0)
Alkaline Phosphatase: 89 IU/L (ref 39–117)
BUN/Creatinine Ratio: 7 — ABNORMAL LOW (ref 9–20)
BUN: 5 mg/dL — ABNORMAL LOW (ref 6–24)
Bilirubin Total: 0.4 mg/dL (ref 0.0–1.2)
CO2: 26 mmol/L (ref 20–29)
Calcium: 9.6 mg/dL (ref 8.7–10.2)
Chloride: 101 mmol/L (ref 96–106)
Creatinine, Ser: 0.69 mg/dL — ABNORMAL LOW (ref 0.76–1.27)
GFR calc Af Amer: 135 mL/min/{1.73_m2} (ref >59)
GFR calc non Af Amer: 117 mL/min/{1.73_m2} (ref >59)
Globulin, Total: 2.9 g/dL (ref 1.5–4.5)
Glucose: 104 mg/dL — ABNORMAL HIGH (ref 65–99)
Potassium: 3.9 mmol/L (ref 3.5–5.2)
Sodium: 138 mmol/L (ref 134–144)
Total Protein: 7.2 g/dL (ref 6.0–8.5)

## 2019-08-02 LAB — LIPID PANEL
Chol/HDL Ratio: 1.4 ratio (ref 0.0–5.0)
Cholesterol, Total: 121 mg/dL (ref 100–199)
HDL: 88 mg/dL (ref >39)
LDL Chol Calc (NIH): 17 mg/dL (ref 0–99)
Triglycerides: 80 mg/dL (ref 0–149)
VLDL Cholesterol Cal: 16 mg/dL (ref 5–40)

## 2019-08-12 ENCOUNTER — Encounter: Payer: Self-pay | Admitting: Nurse Practitioner

## 2019-08-28 MED FILL — AMLODIPINE BESYLATE 5 MG TA: 5 | 30 days supply | Qty: 30 | Fill #1

## 2019-08-28 MED FILL — levETIRAcetam 500 MG TABS: 500 | 30 days supply | Qty: 60 | Fill #1

## 2019-10-09 MED FILL — AMLODIPINE BESYLATE 5 MG TA: 5 | 30 days supply | Qty: 30 | Fill #2

## 2019-10-09 MED FILL — levETIRAcetam 500 MG TABS: 500 | 30 days supply | Qty: 60 | Fill #2

## 2019-11-22 ENCOUNTER — Other Ambulatory Visit: Payer: Self-pay | Admitting: Nurse Practitioner

## 2019-11-22 DIAGNOSIS — G40409 Other generalized epilepsy and epileptic syndromes, not intractable, without status epilepticus: Secondary | ICD-10-CM

## 2019-11-22 MED FILL — levETIRAcetam 500 MG TABS: 500 | 30 days supply | Qty: 60 | Fill #0

## 2019-11-22 MED FILL — AMLODIPINE BESYLATE 5 MG TA: 5 | 30 days supply | Qty: 30 | Fill #3

## 2020-01-09 MED FILL — AMLODIPINE BESYLATE 5 MG TA: 5 | 30 days supply | Qty: 30 | Fill #4

## 2020-01-09 MED FILL — levETIRAcetam 500 MG TABS: 500 | 30 days supply | Qty: 60 | Fill #1

## 2020-04-30 MED FILL — AMLODIPINE BESYLATE 5 MG TA: 5 | 30 days supply | Qty: 30 | Fill #5

## 2020-04-30 MED FILL — levETIRAcetam 500 MG TABS: 500 | 30 days supply | Qty: 60 | Fill #2

## 2020-08-21 ENCOUNTER — Other Ambulatory Visit: Payer: Self-pay | Admitting: Nurse Practitioner

## 2020-08-21 DIAGNOSIS — G40409 Other generalized epilepsy and epileptic syndromes, not intractable, without status epilepticus: Secondary | ICD-10-CM

## 2020-08-21 DIAGNOSIS — I10 Essential (primary) hypertension: Secondary | ICD-10-CM

## 2020-08-21 MED ORDER — AMLODIPINE BESYLATE 5 MG PO TABS: 5.0000 mg | ORAL_TABLET | Freq: Every day | ORAL | 0 refills | Status: DC

## 2020-08-21 MED ORDER — LEVETIRACETAM 500 MG PO TABS: ORAL_TABLET | ORAL | 2 refills | Status: DC

## 2020-08-21 MED FILL — levETIRAcetam 500 MG TABS: 500 | 30 days supply | Qty: 60 | Fill #0

## 2020-08-21 MED FILL — AMLODIPINE BESYLATE 5 MG TA: 5 | 30 days supply | Qty: 30 | Fill #0

## 2020-08-21 NOTE — Telephone Encounter (Signed)
Copied from CRM [id number]. Topic: Quick Communication - Rx Refill/Question >> Aug 21, 2020 10:01 AM Marylen Ponto wrote: Medication: levETIRAcetam (KEPPRA) 500 MG tablet and amLODipine (NORVASC) 5 MG tablet  Has the patient contacted their pharmacy? yes - Pt told to call for appt to receive more refills. Appt scheduled for 10/08/20  Preferred Pharmacy (with phone number or street name): [facility] [place], [place] - [address]  Phone: [phone]   Fax: [phone]  Agent: Please be advised that RX refills may take up to 3 business days. We ask that you follow-up with your pharmacy.

## 2020-10-08 ENCOUNTER — Ambulatory Visit: Payer: Self-pay | Attending: Nurse Practitioner | Admitting: Nurse Practitioner

## 2020-10-08 ENCOUNTER — Encounter: Payer: Self-pay | Admitting: Nurse Practitioner

## 2020-10-08 ENCOUNTER — Other Ambulatory Visit: Payer: Self-pay

## 2020-10-08 ENCOUNTER — Other Ambulatory Visit: Payer: Self-pay | Admitting: Nurse Practitioner

## 2020-10-08 VITALS — BP 150/92 | HR 86 | Resp 16 | Ht 67.0 in | Wt 138.0 lb

## 2020-10-08 DIAGNOSIS — G40409 Other generalized epilepsy and epileptic syndromes, not intractable, without status epilepticus: Secondary | ICD-10-CM

## 2020-10-08 DIAGNOSIS — Z13220 Encounter for screening for lipoid disorders: Secondary | ICD-10-CM

## 2020-10-08 DIAGNOSIS — Z130 Encounter for screening for diseases of the blood and blood-forming organs and certain disorders involving the immune mechanism: Secondary | ICD-10-CM

## 2020-10-08 DIAGNOSIS — I10 Essential (primary) hypertension: Secondary | ICD-10-CM

## 2020-10-08 DIAGNOSIS — Z131 Encounter for screening for diabetes mellitus: Secondary | ICD-10-CM

## 2020-10-08 MED ORDER — LEVETIRACETAM 500 MG PO TABS
ORAL_TABLET | ORAL | 2 refills | Status: DC
Start: 2020-10-08 — End: 2020-10-08

## 2020-10-08 MED ORDER — AMLODIPINE BESYLATE 10 MG PO TABS: 10.0000 mg | ORAL_TABLET | Freq: Every day | ORAL | 0 refills | Status: DC

## 2020-10-08 NOTE — Progress Notes (Signed)
Assessment & Plan:  Michael Hayden was seen today for new patient (initial visit).  Diagnoses and all orders for this visit:  Tonic clonic seizures (HCC) -     Levetiracetam level -     levETIRAcetam (KEPPRA) 500 MG tablet; TAKE 1 TABLET BY MOUTH 2 (TWO) TIMES DAILY.  Essential hypertension -     CMP14+EGFR -     amLODipine (NORVASC) 10 MG tablet; Take 1 tablet (10 mg total) by mouth daily. Continue all antihypertensives as prescribed.  Remember to bring in your blood pressure log with you for your follow up appointment.  DASH/Mediterranean Diets are healthier choices for HTN.    Screening for deficiency anemia -     CBC  Encounter for screening for diabetes mellitus -     Hemoglobin A1c  Lipid screening -     Lipid panel    Patient has been counseled on age-appropriate routine health concerns for screening and prevention. These are reviewed and up-to-date. Referrals have been placed accordingly. Immunizations are up-to-date or declined.    Subjective:   Chief Complaint  Patient presents with  . New Patient (Initial Visit)   HPI Michael Hayden 45 y.o. male presents to office for follow up.   He has a past medical history of Hypertension and Seizures ([place]).   Essential Hypertension Poorly controlled.  Will increase amlodipine from 5 mg to 10 mg daily. Denies chest pain, shortness of breath, palpitations, lightheadedness, dizziness, headaches or BLE edema.  BP Readings from Last 3 Encounters:  10/08/20 (!) 150/92  12/14/18 (!) 144/100  06/10/18 (!) 142/92   Seizures History of tonic clonic seizures and has been on and off with taking keppra. Also a history of ETOH abuse. Trying to cut back on his drinking. He is applying for disability. Last seizure was 3 months ago in December.     Review of Systems  Constitutional: Negative for fever, malaise/fatigue and weight loss.  HENT: Negative.  Negative for nosebleeds.   Eyes: Negative.  Negative for blurred vision, double  vision and photophobia.  Respiratory: Negative.  Negative for cough and shortness of breath.   Cardiovascular: Negative.  Negative for chest pain, palpitations and leg swelling.  Gastrointestinal: Negative.  Negative for heartburn, nausea and vomiting.  Musculoskeletal: Negative.  Negative for myalgias.  Neurological: Positive for seizures. Negative for dizziness, focal weakness and headaches.  Psychiatric/Behavioral: Positive for substance abuse (history of ETOH Abuse). Negative for suicidal ideas.    Past Medical History:  Diagnosis Date  . Hypertension   . Seizures ([place])    doesn't know age of onset, pt reports worse this year    Past Surgical History:  Procedure Laterality Date  . WOUND EXPLORATION Right 06/22/2017   Procedure: WOUND EXPLORATION RIGHT THIGH;  Surgeon: Angelia Mould, MD;  Location: [facility];  Service: Vascular;  Laterality: Right;    History reviewed. No pertinent family history.  Social History Reviewed with no changes to be made today.   Outpatient Medications Prior to Visit  Medication Sig Dispense Refill  . amLODipine (NORVASC) 5 MG tablet Take 1 tablet (5 mg total) by mouth daily. 90 tablet 0  . levETIRAcetam (KEPPRA) 500 MG tablet TAKE 1 TABLET BY MOUTH 2 (TWO) TIMES DAILY. 60 tablet 2  . acetaminophen (TYLENOL) 500 MG tablet Take 500-1,000 mg by mouth every 6 (six) hours as needed (for headaches or pain). (Patient not taking: Reported on 10/08/2020)    . ibuprofen (ADVIL,MOTRIN) 200 MG tablet Take 200-400 mg by  mouth every 8 (eight) hours as needed (for pain or headaches). (Patient not taking: Reported on 10/08/2020)    . Naphazoline-Pheniramine 0.027-0.315 % SOLN Place 1-2 drops into both eyes 3 (three) times daily as needed (for itching). (Patient not taking: Reported on 10/08/2020)    . naproxen sodium (ALEVE) 220 MG tablet Take 220-440 mg by mouth 2 (two) times daily as needed. (Patient not taking: Reported on 10/08/2020)     No facility-administered  medications prior to visit.    No Known Allergies     Objective:    BP (!) 150/92   Pulse 86   Resp 16   Ht _0  (1.702 m)   Wt 138 lb (62.6 kg)   SpO2 97%   BMI 21.61 kg/m  Wt Readings from Last 3 Encounters:  10/08/20 138 lb (62.6 kg)  12/13/18 150 lb (68 kg)  06/10/18 146 lb (66.2 kg)    Physical Exam Vitals and nursing note reviewed.  Constitutional:      Appearance: He is well-developed and well-nourished.  HENT:     Head: Normocephalic and atraumatic.  Eyes:     Extraocular Movements: EOM normal.  Cardiovascular:     Rate and Rhythm: Normal rate and regular rhythm.     Pulses: Intact distal pulses.     Heart sounds: Normal heart sounds. No murmur heard. No friction rub. No gallop.   Pulmonary:     Effort: Pulmonary effort is normal. No tachypnea or respiratory distress.     Breath sounds: Normal breath sounds. No decreased breath sounds, wheezing, rhonchi or rales.  Chest:     Chest wall: No tenderness.  Abdominal:     General: Bowel sounds are normal.     Palpations: Abdomen is soft.  Musculoskeletal:        General: No edema. Normal range of motion.     Cervical back: Normal range of motion.  Skin:    General: Skin is warm and dry.  Neurological:     Mental Status: He is alert and oriented to person, place, and time.     Coordination: Coordination normal.  Psychiatric:        Mood and Affect: Mood and affect normal.        Behavior: Behavior normal. Behavior is cooperative.        Thought Content: Thought content normal.        Judgment: Judgment normal.          Patient has been counseled extensively about nutrition and exercise as well as the importance of adherence with medications and regular follow-up. The patient was given clear instructions to go to ER or return to medical center if symptoms don't improve, worsen or new problems develop. The patient verbalized understanding.   Follow-up: Return in about 3 weeks (around 10/29/2020) for BP  CHECK WITH LUKE in 3 weeks. see me in 3 months.   Gildardo Pounds, FNP-BC [facility] and [facility], [place]   10/08/2020, 4:22 PM

## 2020-10-09 MED FILL — AMLODIPINE BESYLATE 10 MG T: 10 | 30 days supply | Qty: 30 | Fill #0

## 2020-10-09 MED FILL — levETIRAcetam 500 MG TABS: 500 | 30 days supply | Qty: 60 | Fill #0

## 2020-10-11 LAB — CMP14+EGFR
ALT: 67 IU/L — ABNORMAL HIGH (ref 0–44)
AST: 84 IU/L — ABNORMAL HIGH (ref 0–40)
Albumin/Globulin Ratio: 1.6 (ref 1.2–2.2)
Albumin: 4.6 g/dL (ref 4.0–5.0)
Alkaline Phosphatase: 78 IU/L (ref 44–121)
BUN/Creatinine Ratio: 6 — ABNORMAL LOW (ref 9–20)
BUN: 4 mg/dL — ABNORMAL LOW (ref 6–24)
Bilirubin Total: 0.3 mg/dL (ref 0.0–1.2)
CO2: 21 mmol/L (ref 20–29)
Calcium: 9.7 mg/dL (ref 8.7–10.2)
Chloride: 100 mmol/L (ref 96–106)
Creatinine, Ser: 0.68 mg/dL — ABNORMAL LOW (ref 0.76–1.27)
Globulin, Total: 2.9 g/dL (ref 1.5–4.5)
Glucose: 71 mg/dL (ref 65–99)
Potassium: 4.5 mmol/L (ref 3.5–5.2)
Sodium: 140 mmol/L (ref 134–144)
Total Protein: 7.5 g/dL (ref 6.0–8.5)
eGFR: 118 mL/min/{1.73_m2} (ref >59)

## 2020-10-11 LAB — CBC
Hematocrit: 42.9 % (ref 37.5–51.0)
Hemoglobin: 14.6 g/dL (ref 13.0–17.7)
MCH: 31.7 pg (ref 26.6–33.0)
MCHC: 34 g/dL (ref 31.5–35.7)
MCV: 93 fL (ref 79–97)
Platelets: 320 10*3/uL (ref 150–450)
RBC: 4.6 x10E6/uL (ref 4.14–5.80)
RDW: 14.2 % (ref 11.6–15.4)
WBC: 5.5 10*3/uL (ref 3.4–10.8)

## 2020-10-11 LAB — LIPID PANEL
Chol/HDL Ratio: 1.2 ratio (ref 0.0–5.0)
Cholesterol, Total: 147 mg/dL (ref 100–199)
HDL: 124 mg/dL (ref >39)
LDL Chol Calc (NIH): 14 mg/dL (ref 0–99)
Triglycerides: 30 mg/dL (ref 0–149)
VLDL Cholesterol Cal: 9 mg/dL (ref 5–40)

## 2020-10-11 LAB — HEMOGLOBIN A1C
Est. average glucose Bld gHb Est-mCnc: 91 mg/dL
Hgb A1c MFr Bld: 4.8 % (ref 4.8–5.6)

## 2020-10-11 LAB — LEVETIRACETAM LEVEL: Levetiracetam Lvl: 13.8 ug/mL (ref 10.0–40.0)

## 2020-10-22 ENCOUNTER — Encounter: Payer: Self-pay | Admitting: *Deleted

## 2020-10-29 ENCOUNTER — Ambulatory Visit: Payer: Self-pay | Admitting: Pharmacist

## 2020-11-07 ENCOUNTER — Encounter: Payer: Self-pay | Admitting: Pharmacist

## 2020-11-07 ENCOUNTER — Other Ambulatory Visit: Payer: Self-pay

## 2020-11-07 ENCOUNTER — Ambulatory Visit: Payer: Self-pay | Attending: Nurse Practitioner | Admitting: Pharmacist

## 2020-11-07 VITALS — BP 142/90 | HR 96

## 2020-11-07 DIAGNOSIS — I10 Essential (primary) hypertension: Secondary | ICD-10-CM

## 2020-11-07 MED FILL — levETIRAcetam 500 MG TABS: 500 | 30 days supply | Qty: 60 | Fill #0

## 2020-11-07 MED FILL — AMLODIPINE BESYLATE 10 MG T: 10 | 30 days supply | Qty: 30 | Fill #0

## 2020-11-07 NOTE — Progress Notes (Signed)
   S:    PCP: Zelda   Patient arrives in good spirits.    Presents to the clinic for hypertension evaluation, counseling, and management. Patient was referred and last seen by Primary Care Provider on 10/08/2020.    Medication adherence reported.  Current BP Medications include:  Amlodipine 10 mg daily  Dietary habits include: denies drinking sodas; denies eating excessive sodium  Exercise habits include: none reported Family / Social history:  - FHx: no pertinent positives  - Tobacco: current 1 PPD smoker  - Alcohol: trying to cut back on his drinking  O:  Vitals:   11/07/20 1048  BP: (!) 142/90  Pulse: 96   Home BP readings: none  Last 3 Office BP readings: BP Readings from Last 3 Encounters:  11/07/20 (!) 142/90  10/08/20 (!) 150/92  12/14/18 (!) 144/100    BMET    Component Value Date/Time   NA 140 10/08/2020 1612   K 4.5 10/08/2020 1612   CL 100 10/08/2020 1612   CO2 21 10/08/2020 1612   GLUCOSE 71 10/08/2020 1612   GLUCOSE 81 04/07/2018 1706   BUN 4 (L) 10/08/2020 1612   CREATININE 0.68 (L) 10/08/2020 1612   CALCIUM 9.7 10/08/2020 1612   GFRNONAA 117 08/01/2019 1031   GFRAA 135 08/01/2019 1031    Renal function: CrCl cannot be calculated (Patient's most recent lab result is older than the maximum 21 days allowed.).  Clinical ASCVD: No  The ASCVD Risk score Denman George DC Jr., et al., 2013) failed to calculate for the following reasons:   The valid HDL cholesterol range is 20 to 100 mg/dL  A/P: Hypertension longstanding currently above goal on current medications. BP Goal = < 130/80 mmHg. Medication adherence reported. SBP has improved with increased dose of amlodipine. Will see him back in 1 month for reassessment. No changes right now.  -Continued amlodipine 10 mg daily.  -Counseled on lifestyle modifications for blood pressure control including reduced dietary sodium, increased exercise, adequate sleep.  Results reviewed and written information provided.    Total time in face-to-face counseling 15 minutes.   F/U Clinic Visit in 1 month.   Butch Penny, PharmD, P[place]y [place], CPP Clinical Pharmacist [facility] & [facility] [phone]

## 2020-12-09 ENCOUNTER — Ambulatory Visit: Payer: Self-pay | Attending: Nurse Practitioner | Admitting: Pharmacist

## 2020-12-09 ENCOUNTER — Encounter: Payer: Self-pay | Admitting: Pharmacist

## 2020-12-09 ENCOUNTER — Other Ambulatory Visit: Payer: Self-pay

## 2020-12-09 VITALS — BP 118/73 | HR 91

## 2020-12-09 DIAGNOSIS — I10 Essential (primary) hypertension: Secondary | ICD-10-CM

## 2020-12-09 NOTE — Progress Notes (Signed)
   S:    PCP: Zelda   Patient arrives in good spirits. Presents to the clinic for hypertension evaluation, counseling, and management. Patient was referred and last seen by Primary Care Provider on 10/08/2020.  I saw him 11/07/2020 and made no changes.  Medication adherence reported. He tells me he has really focused on not missing any doses.   Current BP Medications include:  Amlodipine 10 mg daily  Dietary habits include: denies drinking sodas; denies eating excessive sodium  Exercise habits include: none reported Family / Social history:  - FHx: no pertinent positives  - Tobacco: current 1 PPD smoker  - Alcohol: trying to cut back on his drinking  O:  Vitals:   12/09/20 1054  BP: 118/73  Pulse: 91   Home BP readings: none  Last 3 Office BP readings: BP Readings from Last 3 Encounters:  12/09/20 118/73  11/07/20 (!) 142/90  10/08/20 (!) 150/92    BMET    Component Value Date/Time   NA 140 10/08/2020 1612   K 4.5 10/08/2020 1612   CL 100 10/08/2020 1612   CO2 21 10/08/2020 1612   GLUCOSE 71 10/08/2020 1612   GLUCOSE 81 04/07/2018 1706   BUN 4 (L) 10/08/2020 1612   CREATININE 0.68 (L) 10/08/2020 1612   CALCIUM 9.7 10/08/2020 1612   GFRNONAA 117 08/01/2019 1031   GFRAA 135 08/01/2019 1031    Renal function: CrCl cannot be calculated (Patient's most recent lab result is older than the maximum 21 days allowed.).  Clinical ASCVD: No  The ASCVD Risk score Denman George DC Jr., et al., 2013) failed to calculate for the following reasons:   The valid HDL cholesterol range is 20 to 100 mg/dL  A/P: Hypertension longstanding currently at goal on current medications. BP Goal = < 130/80 mmHg. Medication adherence reported. No medication changes today.   -Continued amlodipine 10 mg daily.  -Counseled on lifestyle modifications for blood pressure control including reduced dietary sodium, increased exercise, adequate sleep.  Results reviewed and written information provided.   Total  time in face-to-face counseling 15 minutes.   F/U Clinic Visit in 1 month with PCP.    Butch Penny, PharmD, P[place]y [place], CPP Clinical Pharmacist [facility] & [facility] [phone]

## 2021-01-09 ENCOUNTER — Other Ambulatory Visit: Payer: Self-pay

## 2021-01-09 MED FILL — Amlodipine Besylate Tab 10 MG (Base Equivalent): ORAL | 30 days supply | Qty: 30 | Fill #0 | Status: CN

## 2021-01-09 MED FILL — Levetiracetam Tab 500 MG: ORAL | 30 days supply | Qty: 60 | Fill #0 | Status: CN

## 2021-01-10 ENCOUNTER — Ambulatory Visit: Payer: Self-pay | Attending: Nurse Practitioner | Admitting: Nurse Practitioner

## 2021-01-10 ENCOUNTER — Other Ambulatory Visit: Payer: Self-pay

## 2021-01-10 ENCOUNTER — Encounter: Payer: Self-pay | Admitting: Nurse Practitioner

## 2021-01-10 DIAGNOSIS — G40409 Other generalized epilepsy and epileptic syndromes, not intractable, without status epilepticus: Secondary | ICD-10-CM

## 2021-01-10 DIAGNOSIS — I10 Essential (primary) hypertension: Secondary | ICD-10-CM

## 2021-01-10 MED ORDER — AMLODIPINE BESYLATE 10 MG PO TABS
ORAL_TABLET | Freq: Every day | ORAL | 1 refills | Status: DC
  Filled 2021-01-10: qty 30, 30d supply, fill #0
  Filled 2021-01-10: qty 90, fill #0
  Filled 2021-03-11: qty 30, 30d supply, fill #1
  Filled 2021-05-29: qty 30, 30d supply, fill #2
  Filled 2021-09-01 – 2021-09-10 (×2): qty 30, 30d supply, fill #0

## 2021-01-10 MED ORDER — LEVETIRACETAM 500 MG PO TABS
ORAL_TABLET | Freq: Two times a day (BID) | ORAL | 2 refills | Status: DC
  Filled 2021-01-10: qty 60, 30d supply, fill #0
  Filled 2021-01-10: qty 180, fill #0
  Filled 2021-03-11: qty 60, 30d supply, fill #1
  Filled 2021-05-29: qty 60, 30d supply, fill #2
  Filled 2021-09-01 – 2021-09-10 (×2): qty 60, 30d supply, fill #0

## 2021-01-10 NOTE — Progress Notes (Signed)
Assessment & Plan:  Michael Hayden was seen today for hypertension.  Diagnoses and all orders for this visit:  Essential hypertension -     amLODipine (NORVASC) 10 MG tablet; TAKE 1 TABLET (10 MG TOTAL) BY MOUTH DAILY. Continue all antihypertensives as prescribed.  Remember to bring in your blood pressure log with you for your follow up appointment.  DASH/Mediterranean Diets are healthier choices for HTN.    Tonic clonic seizures (HCC) -     levETIRAcetam (KEPPRA) 500 MG tablet; TAKE 1 TABLET BY MOUTH 2 (TWO) TIMES DAILY.    Patient has been counseled on age-appropriate routine health concerns for screening and prevention. These are reviewed and up-to-date. Referrals have been placed accordingly. Immunizations are up-to-date or declined.    Subjective:   Chief Complaint  Patient presents with  . Hypertension   HPI Michael Hayden 45 y.o. male presents to office today for follow up. Denies any recent seizure activity. Taking keppra and amlodipine daily as prescribed.   Essential Hypertension Well controlled with amlodipine 10 mg daily. Denies chest pain, shortness of breath, palpitations, lightheadedness, dizziness, headaches or BLE edema.  BP Readings from Last 3 Encounters:  01/10/21 109/70  12/09/20 118/73  11/07/20 (!) 142/90      Review of Systems  Constitutional: Negative for fever, malaise/fatigue and weight loss.  HENT: Negative.  Negative for nosebleeds.   Eyes: Negative.  Negative for blurred vision, double vision and photophobia.  Respiratory: Negative.  Negative for cough and shortness of breath.   Cardiovascular: Negative.  Negative for chest pain, palpitations and leg swelling.  Gastrointestinal: Negative.  Negative for heartburn, nausea and vomiting.  Musculoskeletal: Negative.  Negative for myalgias.  Neurological: Negative.  Negative for dizziness, focal weakness, seizures and headaches.  Psychiatric/Behavioral: Negative.  Negative for suicidal ideas.     Past Medical History:  Diagnosis Date  . Hypertension   . Seizures (HCC)    doesn't know age of onset, pt reports worse this year    Past Surgical History:  Procedure Laterality Date  . WOUND EXPLORATION Right 06/22/2017   Procedure: WOUND EXPLORATION RIGHT THIGH;  Surgeon: Chuck Hint, MD;  Location: [facility] OR;  Service: Vascular;  Laterality: Right;    History reviewed. No pertinent family history.  Social History Reviewed with no changes to be made today.   Outpatient Medications Prior to Visit  Medication Sig Dispense Refill  . amLODipine (NORVASC) 10 MG tablet TAKE 1 TABLET (10 MG TOTAL) BY MOUTH DAILY. 90 tablet 0  . levETIRAcetam (KEPPRA) 500 MG tablet TAKE 1 TABLET BY MOUTH 2 (TWO) TIMES DAILY. 60 tablet 2   No facility-administered medications prior to visit.    No Known Allergies     Objective:    BP 109/70   Pulse 84   Ht 5\' 7"  (1.702 m)   Wt 136 lb 3.2 oz (61.8 kg)   SpO2 99%   BMI 21.33 kg/m  Wt Readings from Last 3 Encounters:  01/10/21 136 lb 3.2 oz (61.8 kg)  10/08/20 138 lb (62.6 kg)  12/13/18 150 lb (68 kg)    Physical Exam Vitals and nursing note reviewed.  Constitutional:      Appearance: He is well-developed.  HENT:     Head: Normocephalic and atraumatic.  Cardiovascular:     Rate and Rhythm: Normal rate and regular rhythm.     Heart sounds: Normal heart sounds. No murmur heard. No friction rub. No gallop.   Pulmonary:     Effort: Pulmonary  effort is normal. No tachypnea or respiratory distress.     Breath sounds: Normal breath sounds. No decreased breath sounds, wheezing, rhonchi or rales.  Chest:     Chest wall: No tenderness.  Abdominal:     General: Bowel sounds are normal.     Palpations: Abdomen is soft.  Musculoskeletal:        General: Normal range of motion.     Cervical back: Normal range of motion.  Skin:    General: Skin is warm and dry.  Neurological:     Mental Status: He is alert and oriented to  person, place, and time.     Coordination: Coordination normal.  Psychiatric:        Behavior: Behavior normal. Behavior is cooperative.        Thought Content: Thought content normal.        Judgment: Judgment normal.          Patient has been counseled extensively about nutrition and exercise as well as the importance of adherence with medications and regular follow-up. The patient was given clear instructions to go to ER or return to medical center if symptoms don't improve, worsen or new problems develop. The patient verbalized understanding.   Follow-up: Return in about 3 months (around 04/12/2021).   Claiborne Rigg, FNP-BC [facility] and [facility] [place], [place] [phone]   01/10/2021, 10:25 AM

## 2021-03-11 ENCOUNTER — Other Ambulatory Visit: Payer: Self-pay

## 2021-03-18 ENCOUNTER — Other Ambulatory Visit (HOSPITAL_COMMUNITY): Payer: Self-pay

## 2021-04-15 ENCOUNTER — Ambulatory Visit: Payer: Self-pay | Attending: Nurse Practitioner | Admitting: Nurse Practitioner

## 2021-04-15 ENCOUNTER — Other Ambulatory Visit: Payer: Self-pay

## 2021-04-15 ENCOUNTER — Encounter: Payer: Self-pay | Admitting: Nurse Practitioner

## 2021-04-15 VITALS — BP 114/74 | HR 90 | Ht 67.0 in | Wt 135.2 lb

## 2021-04-15 DIAGNOSIS — I10 Essential (primary) hypertension: Secondary | ICD-10-CM

## 2021-04-15 DIAGNOSIS — Z87898 Personal history of other specified conditions: Secondary | ICD-10-CM

## 2021-04-15 NOTE — Progress Notes (Signed)
Assessment & Plan:  Michael Hayden was seen today for hypertension.  Diagnoses and all orders for this visit:  Essential hypertension -     CMP14+EGFR Continue all antihypertensives as prescribed.  Remember to bring in your blood pressure log with you for your follow up appointment.  DASH/Mediterranean Diets are healthier choices for HTN.    History of seizure -     Levetiracetam level   Patient has been counseled on age-appropriate routine health concerns for screening and prevention. These are reviewed and up-to-date. Referrals have been placed accordingly. Immunizations are up-to-date or declined.    Subjective:   Chief Complaint  Patient presents with   Hypertension   Hypertension Pertinent negatives include no blurred vision, chest pain, headaches, malaise/fatigue, palpitations or shortness of breath.  Michael Hayden 45 y.o. male presents to office today for follow up.  has a past medical history of H/O ETOH abuse, Hypertension, and Seizures ([place]).   HTN Blood pressure is well controlled today. Currently taking amlodipine 64m daily as prescribed. Denies chest pain, shortness of breath, palpitations, lightheadedness, dizziness, headaches or BLE edema.   BP Readings from Last 3 Encounters:  04/15/21 114/74  01/10/21 109/70  12/09/20 118/73     History of seizures Denies any recent seizure activity. Last known seizure was 07-2020. He endorses adherence taking keppra daily as prescribed. He has not stopped drinking completely. He does not drive.   Review of Systems  Constitutional:  Negative for fever, malaise/fatigue and weight loss.  HENT: Negative.  Negative for nosebleeds.   Eyes: Negative.  Negative for blurred vision, double vision and photophobia.  Respiratory: Negative.  Negative for cough and shortness of breath.   Cardiovascular: Negative.  Negative for chest pain, palpitations and leg swelling.  Gastrointestinal: Negative.  Negative for heartburn, nausea and  vomiting.  Musculoskeletal: Negative.  Negative for myalgias.  Neurological: Negative.  Negative for dizziness, focal weakness, seizures and headaches.  Psychiatric/Behavioral: Negative.  Negative for suicidal ideas.    Past Medical History:  Diagnosis Date   H/O ETOH abuse    Hypertension    [place]    doesn't know age of onset, pt reports worse this year    Past Surgical History:  Procedure Laterality Date   WOUND EXPLORATION Right 06/22/2017   Procedure: WOUND EXPLORATION RIGHT THIGH;  Surgeon: DAngelia Mould MD;  [facility]  Service: [facility];  Laterality: Right;    No family history on file.  Social History Reviewed with no changes to be made today.   Outpatient Medications Prior to Visit  Medication Sig Dispense Refill   amLODipine (NORVASC) 10 MG tablet TAKE 1 TABLET (10 MG TOTAL) BY MOUTH DAILY. 90 tablet 1   levETIRAcetam (KEPPRA) 500 MG tablet TAKE 1 TABLET BY MOUTH 2 (TWO) TIMES DAILY. 180 tablet 2   No facility-administered medications prior to visit.    No Known Allergies     Objective:    BP 114/74   Pulse 90   Ht _0  (1.702 m)   Wt 135 lb 4 oz (61.3 kg)   SpO2 98%   BMI 21.18 kg/m  Wt Readings from Last 3 Encounters:  04/15/21 135 lb 4 oz (61.3 kg)  01/10/21 136 lb 3.2 oz (61.8 kg)  10/08/20 138 lb (62.6 kg)    Physical Exam Vitals and nursing note reviewed.  Constitutional:      Appearance: He is well-developed.  HENT:     Head: Normocephalic and atraumatic.  Cardiovascular:  Rate and Rhythm: Normal rate and regular rhythm.     Heart sounds: Normal heart sounds. No murmur heard.   No friction rub. No gallop.  Pulmonary:     Effort: Pulmonary effort is normal. No tachypnea or respiratory distress.     Breath sounds: Normal breath sounds. No decreased breath sounds, wheezing, rhonchi or rales.  Chest:     Chest wall: No tenderness.  Abdominal:     General: Bowel sounds are normal.     Palpations: Abdomen is  soft.  Musculoskeletal:        General: Normal range of motion.     Cervical back: Normal range of motion.  Skin:    General: Skin is warm and dry.  Neurological:     Mental Status: He is alert and oriented to person, place, and time.     Coordination: Coordination normal.  Psychiatric:        Behavior: Behavior normal. Behavior is cooperative.        Thought Content: Thought content normal.        Judgment: Judgment normal.         Patient has been counseled extensively about nutrition and exercise as well as the importance of adherence with medications and regular follow-up. The patient was given clear instructions to go to ER or return to medical center if symptoms don't improve, worsen or new problems develop. The patient verbalized understanding.   Follow-up: Return in about 6 months (around 10/13/2021).   Gildardo Pounds, FNP-BC [facility] and [facility] [place], [place]   04/15/2021, 9:32 AM

## 2021-04-17 LAB — CMP14+EGFR
ALT: 71 IU/L — ABNORMAL HIGH (ref 0–44)
AST: 69 IU/L — ABNORMAL HIGH (ref 0–40)
Albumin/Globulin Ratio: 1.7 (ref 1.2–2.2)
Albumin: 4.6 g/dL (ref 4.0–5.0)
Alkaline Phosphatase: 90 IU/L (ref 44–121)
BUN/Creatinine Ratio: 4 — ABNORMAL LOW (ref 9–20)
BUN: 3 mg/dL — ABNORMAL LOW (ref 6–24)
Bilirubin Total: 0.2 mg/dL (ref 0.0–1.2)
CO2: 24 mmol/L (ref 20–29)
Calcium: 9.4 mg/dL (ref 8.7–10.2)
Chloride: 101 mmol/L (ref 96–106)
Creatinine, Ser: 0.7 mg/dL — ABNORMAL LOW (ref 0.76–1.27)
Globulin, Total: 2.7 g/dL (ref 1.5–4.5)
Glucose: 82 mg/dL (ref 65–99)
Potassium: 4.2 mmol/L (ref 3.5–5.2)
Sodium: 142 mmol/L (ref 134–144)
Total Protein: 7.3 g/dL (ref 6.0–8.5)
eGFR: 117 mL/min/{1.73_m2} (ref >59)

## 2021-04-17 LAB — LEVETIRACETAM LEVEL: Levetiracetam Lvl: 15.8 ug/mL (ref 10.0–40.0)

## 2021-05-29 ENCOUNTER — Other Ambulatory Visit: Payer: Self-pay

## 2021-07-25 ENCOUNTER — Encounter: Payer: Self-pay | Admitting: Nurse Practitioner

## 2021-09-01 ENCOUNTER — Other Ambulatory Visit: Payer: Self-pay

## 2021-09-05 ENCOUNTER — Other Ambulatory Visit: Payer: Self-pay

## 2021-09-10 ENCOUNTER — Other Ambulatory Visit: Payer: Self-pay

## 2021-09-11 ENCOUNTER — Other Ambulatory Visit: Payer: Self-pay

## 2021-10-14 ENCOUNTER — Other Ambulatory Visit: Payer: Self-pay

## 2021-10-14 ENCOUNTER — Encounter: Payer: Self-pay | Admitting: Nurse Practitioner

## 2021-10-14 ENCOUNTER — Ambulatory Visit: Payer: Self-pay | Attending: Nurse Practitioner | Admitting: Nurse Practitioner

## 2021-10-14 DIAGNOSIS — I10 Essential (primary) hypertension: Secondary | ICD-10-CM

## 2021-10-14 DIAGNOSIS — G40409 Other generalized epilepsy and epileptic syndromes, not intractable, without status epilepticus: Secondary | ICD-10-CM

## 2021-10-14 MED ORDER — AMLODIPINE BESYLATE 10 MG PO TABS
10.0000 mg | ORAL_TABLET | Freq: Every day | ORAL | 1 refills | Status: DC
  Filled 2021-10-14 – 2021-11-28 (×2): qty 30, 30d supply, fill #0
  Filled 2022-02-20: qty 30, 30d supply, fill #1
  Filled 2022-04-27: qty 30, 30d supply, fill #2
  Filled 2022-06-25: qty 30, 30d supply, fill #3
  Filled 2022-08-26: qty 30, 30d supply, fill #4
  Filled 2022-10-01: qty 30, 30d supply, fill #5

## 2021-10-14 MED ORDER — LEVETIRACETAM 500 MG PO TABS
500.0000 mg | ORAL_TABLET | Freq: Two times a day (BID) | ORAL | 1 refills | Status: DC
  Filled 2021-10-14 – 2021-11-28 (×2): qty 60, 30d supply, fill #0
  Filled 2022-02-20: qty 60, 30d supply, fill #1
  Filled 2022-04-27: qty 60, 30d supply, fill #2
  Filled 2022-06-25: qty 60, 30d supply, fill #3
  Filled 2022-08-26: qty 60, 30d supply, fill #4
  Filled 2022-10-01: qty 60, 30d supply, fill #5

## 2021-10-14 NOTE — Progress Notes (Signed)
Virtual Visit via Telephone Note ?Due to national recommendations of social distancing due to COVID 19, telehealth visit is felt to be most appropriate for this patient at this time.  I discussed the limitations, risks, security and privacy concerns of performing an evaluation and management service by telephone and the availability of in person appointments. I also discussed with the patient that there may be a patient responsible charge related to this service. The patient expressed understanding and agreed to proceed.  ? ? ?I connected with [name] on 10/14/21  at   9:10 AM EST  EDT by telephone and verified that I am speaking with the correct person using two identifiers. ? ?Location of Patient: ?Private Residence ?  ?Location of Provider: ?[employer]  ?  ?Persons participating in Telemedicine visit: ?Bertram Denver FNP-BC ?[name]  ?  ?History of Present Illness: ?Telemedicine visit for: HTN and Seizures ?HE has a past medical history of H/O ETOH abuse, Hypertension, and Seizures (HCC).  ? ?Denies any recent seizure activity. Taking keppra as prescribed however both blood pressure medication and seizure medication show expired with overdue refills remaining since last year.  ? ?HTN ?Notes normal blood pressure readings at home with SBP <130 but can not recall any specific readings. Notes Taking amlodipine 10 mg daily as prescribed. He does have a BP monitor at home.  ?BP Readings from Last 3 Encounters:  ?04/15/21 114/74  ?01/10/21 109/70  ?12/09/20 118/73  ?  ? ? ? ? ?Past Medical History:  ?Diagnosis Date  ? H/O ETOH abuse   ? Hypertension   ? Seizures (HCC)   ? doesn't know age of onset, pt reports worse this year  ?  ?Past Surgical History:  ?Procedure Laterality Date  ? WOUND EXPLORATION Right 06/22/2017  ? Procedure: WOUND EXPLORATION RIGHT THIGH;  Surgeon: Chuck Hint, MD;  Location: [facility] OR;  Service: Vascular;  Laterality: Right;  ?  ?History  reviewed. No pertinent family history.  ?Social History  ? ?Socioeconomic History  ? Marital status: Single  ?  Spouse name: Not on file  ? Number of children: Not on file  ? Years of education: Not on file  ? Highest education level: Not on file  ?Occupational History  ? Not on file  ?Tobacco Use  ? Smoking status: Every Day  ?  Packs/day: 1.00  ?  Types: Cigarettes  ? Smokeless tobacco: Never  ? Tobacco comments:  ?  1/2-1 pk per day  ?Vaping Use  ? Vaping Use: Every day  ?Substance and Sexual Activity  ? Alcohol use: Not Currently  ?  Alcohol/week: 4.0 standard drinks  ?  Types: 4 Cans of beer per week  ? Drug use: No  ? Sexual activity: Yes  ?  Partners: Female  ?  Birth control/protection: None  ?Other Topics Concern  ? Not on file  ?Social History Narrative  ? ** Merged History Encounter **  ?    ? ?Social Determinants of Health  ? ?Financial Resource Strain: Not on file  ?Food Insecurity: Not on file  ?Transportation Needs: Not on file  ?Physical Activity: Not on file  ?Stress: Not on file  ?Social Connections: Not on file  ?  ? ?Observations/Objective: ?Awake, alert and oriented x 3 ? ? ?Review of Systems  ?Constitutional:  Negative for fever, malaise/fatigue and weight loss.  ?HENT: Negative.  Negative for nosebleeds.   ?Eyes: Negative.  Negative for blurred vision, double vision and  photophobia.  ?Respiratory: Negative.  Negative for cough and shortness of breath.   ?Cardiovascular: Negative.  Negative for chest pain, palpitations and leg swelling.  ?Gastrointestinal: Negative.  Negative for heartburn, nausea and vomiting.  ?Musculoskeletal: Negative.  Negative for myalgias.  ?Neurological: Negative.  Negative for dizziness, focal weakness, seizures and headaches.  ?Psychiatric/Behavioral: Negative.  Negative for suicidal ideas.    ?Assessment and Plan: ?Diagnoses and all orders for this visit: ? ?Essential hypertension ?-     amLODipine (NORVASC) 10 MG tablet; Take 1 tablet (10 mg total) by mouth  daily. ?Continue all antihypertensives as prescribed.  ?Remember to bring in your blood pressure log with you for your follow up appointment.  ?DASH/Mediterranean Diets are healthier choices for HTN.   ? ?Tonic clonic seizures (HCC) ?-     levETIRAcetam (KEPPRA) 500 MG tablet; Take 1 tablet (500 mg total) by mouth 2 (two) times daily. ? ?  ? ?Follow Up Instructions ?Return in about 3 months (around 01/14/2022).  ? ?  ?I discussed the assessment and treatment plan with the patient. The patient was provided an opportunity to ask questions and all were answered. The patient agreed with the plan and demonstrated an understanding of the instructions. ?  ?The patient was advised to call back or seek an in-person evaluation if the symptoms worsen or if the condition fails to improve as anticipated. ? ?I provided 5 minutes of non-face-to-face time during this encounter including median intraservice time, reviewing previous notes, labs, imaging, medications and explaining diagnosis and management. ? ?Claiborne Rigg, FNP-BC  ?

## 2021-10-21 ENCOUNTER — Other Ambulatory Visit: Payer: Self-pay

## 2021-11-28 ENCOUNTER — Other Ambulatory Visit: Payer: Self-pay

## 2021-12-01 ENCOUNTER — Other Ambulatory Visit: Payer: Self-pay

## 2021-12-01 ENCOUNTER — Encounter: Payer: Self-pay | Admitting: Nurse Practitioner

## 2021-12-01 ENCOUNTER — Ambulatory Visit: Payer: Self-pay | Attending: Nurse Practitioner | Admitting: Nurse Practitioner

## 2021-12-01 VITALS — BP 127/86 | HR 96 | Wt 131.4 lb

## 2021-12-01 DIAGNOSIS — Z130 Encounter for screening for diseases of the blood and blood-forming organs and certain disorders involving the immune mechanism: Secondary | ICD-10-CM

## 2021-12-01 DIAGNOSIS — G40409 Other generalized epilepsy and epileptic syndromes, not intractable, without status epilepticus: Secondary | ICD-10-CM

## 2021-12-01 DIAGNOSIS — I10 Essential (primary) hypertension: Secondary | ICD-10-CM

## 2021-12-01 DIAGNOSIS — Z8669 Personal history of other diseases of the nervous system and sense organs: Secondary | ICD-10-CM

## 2021-12-01 NOTE — Progress Notes (Signed)
? ?Assessment & Plan:  ?Michael Hayden was seen today for hypertension. ? ?Diagnoses and all orders for this visit: ? ?Essential hypertension ?-     CMP14+EGFR ?Continue Amlodipine  as prescribed.  ?Remember to bring in your blood pressure log with you for your follow up appointment.  ?DASH/Mediterranean Diets are healthier choices for HTN.   ? ?Hx of tonic-clonic seizures ?-     Levetiracetam level ? ?Screening for deficiency anemia ?-     CBC with Differential ? ? ? ?Patient has been counseled on age-appropriate routine health concerns for screening and prevention. These are reviewed and up-to-date. Referrals have been placed accordingly. Immunizations are up-to-date or declined.    ?Subjective:  ? ?Chief Complaint  ?Patient presents with  ? Hypertension  ? ? ? ? ?Michael Hayden 46 y.o. male presents to office today for follow-up to hypertension. ?He has a past medical history of H/O ETOH abuse, Hypertension, and Seizures  ? ? ?HTN ?Blood pressure is well controlled today with amlodipine 10 mg daily.  He does not monitor his blood pressure at home.  He is still smoking cigarettes daily. ?BP Readings from Last 3 Encounters:  ?12/01/21 127/86  ?04/15/21 114/74  ?01/10/21 109/70  ? Marland Kitchen  ?Seizure ?Denies any recent seizure activity.  Taking Keppra 500 mg twice daily as prescribed ?Levetiracetam level therapeutic ? ?Review of Systems  ?Constitutional:  Negative for fever, malaise/fatigue and weight loss.  ?HENT: Negative.  Negative for nosebleeds.   ?Eyes: Negative.  Negative for blurred vision, double vision and photophobia.  ?Respiratory: Negative.  Negative for cough and shortness of breath.   ?Cardiovascular: Negative.  Negative for chest pain, palpitations and leg swelling.  ?Gastrointestinal: Negative.  Negative for heartburn, nausea and vomiting.  ?Musculoskeletal: Negative.  Negative for myalgias.  ?Neurological: Negative.  Negative for dizziness, focal weakness, seizures and headaches.  ?Psychiatric/Behavioral:  Negative.  Negative for suicidal ideas.   ? ?Past Medical History:  ?Diagnosis Date  ? H/O ETOH abuse   ? Hypertension   ? Seizures ([place])   ? doesn't know age of onset, pt reports worse this year  ? ? ?Past Surgical History:  ?Procedure Laterality Date  ? WOUND EXPLORATION Right 06/22/2017  ? Procedure: WOUND EXPLORATION RIGHT THIGH;  Surgeon: Angelia Mould, MD;  Location: [facility];  Service: Vascular;  Laterality: Right;  ? ? ?No family history on file. ? ?Social History Reviewed with no changes to be made today.  ? ?Outpatient Medications Prior to Visit  ?Medication Sig Dispense Refill  ? amLODipine (NORVASC) 10 MG tablet Take 1 tablet (10 mg total) by mouth daily. 90 tablet 1  ? levETIRAcetam (KEPPRA) 500 MG tablet Take 1 tablet (500 mg total) by mouth 2 (two) times daily. 180 tablet 1  ? ?No facility-administered medications prior to visit.  ? ? ?No Known Allergies ? ?   ?Objective:  ?  ?BP 127/86   Pulse 96   Wt 131 lb 6.4 oz (59.6 kg)   SpO2 98%   BMI 20.58 kg/m?  ?Wt Readings from Last 3 Encounters:  ?12/01/21 131 lb 6.4 oz (59.6 kg)  ?04/15/21 135 lb 4 oz (61.3 kg)  ?01/10/21 136 lb 3.2 oz (61.8 kg)  ? ? ?Physical Exam ?Vitals and nursing note reviewed.  ?Constitutional:   ?   Appearance: He is well-developed.  ?HENT:  ?   Head: Normocephalic and atraumatic.  ?Cardiovascular:  ?   Rate and Rhythm: Normal rate and regular rhythm.  ?  Heart sounds: Normal heart sounds. No murmur heard. ?  No friction rub. No gallop.  ?Pulmonary:  ?   Effort: Pulmonary effort is normal. No tachypnea or respiratory distress.  ?   Breath sounds: Normal breath sounds. No decreased breath sounds, wheezing, rhonchi or rales.  ?Chest:  ?   Chest wall: No tenderness.  ?Abdominal:  ?   General: Bowel sounds are normal.  ?   Palpations: Abdomen is soft.  ?Musculoskeletal:     ?   General: Normal range of motion.  ?   Cervical back: Normal range of motion.  ?Skin: ?   General: Skin is warm and dry.  ?Neurological:  ?   Mental  Status: He is alert and oriented to person, place, and time.  ?   Coordination: Coordination normal.  ?Psychiatric:     ?   Behavior: Behavior normal. Behavior is cooperative.     ?   Thought Content: Thought content normal.     ?   Judgment: Judgment normal.  ? ? ? ? ?   ?Patient has been counseled extensively about nutrition and exercise as well as the importance of adherence with medications and regular follow-up. The patient was given clear instructions to go to ER or return to medical center if symptoms don't improve, worsen or new problems develop. The patient verbalized understanding.  ? ?Follow-up: Return in about 3 months (around 03/02/2022).  ? ?Gildardo Pounds, FNP-BC ?[place] ?[place], [place] ?[phone]   ?12/01/2021, 10:27 AM ?

## 2021-12-03 LAB — CBC WITH DIFFERENTIAL/PLATELET
Basophils Absolute: 0.1 10*3/uL (ref 0.0–0.2)
Basos: 2 %
EOS (ABSOLUTE): 0.1 10*3/uL (ref 0.0–0.4)
Eos: 2 %
Hematocrit: 39 % (ref 37.5–51.0)
Hemoglobin: 13.9 g/dL (ref 13.0–17.7)
Immature Grans (Abs): 0 10*3/uL (ref 0.0–0.1)
Immature Granulocytes: 0 %
Lymphocytes Absolute: 1 10*3/uL (ref 0.7–3.1)
Lymphs: 19 %
MCH: 32.5 pg (ref 26.6–33.0)
MCHC: 35.6 g/dL (ref 31.5–35.7)
MCV: 91 fL (ref 79–97)
Monocytes Absolute: 0.7 10*3/uL (ref 0.1–0.9)
Monocytes: 13 %
Neutrophils Absolute: 3.5 10*3/uL (ref 1.4–7.0)
Neutrophils: 64 %
Platelets: 284 10*3/uL (ref 150–450)
RBC: 4.28 x10E6/uL (ref 4.14–5.80)
RDW: 13 % (ref 11.6–15.4)
WBC: 5.4 10*3/uL (ref 3.4–10.8)

## 2021-12-03 LAB — CMP14+EGFR
ALT: 131 IU/L — ABNORMAL HIGH (ref 0–44)
AST: 314 IU/L — ABNORMAL HIGH (ref 0–40)
Albumin/Globulin Ratio: 1.8 (ref 1.2–2.2)
Albumin: 4.9 g/dL (ref 4.0–5.0)
Alkaline Phosphatase: 88 IU/L (ref 44–121)
BUN/Creatinine Ratio: 4 — ABNORMAL LOW (ref 9–20)
BUN: 3 mg/dL — ABNORMAL LOW (ref 6–24)
Bilirubin Total: 0.7 mg/dL (ref 0.0–1.2)
CO2: 25 mmol/L (ref 20–29)
Calcium: 10.2 mg/dL (ref 8.7–10.2)
Chloride: 98 mmol/L (ref 96–106)
Creatinine, Ser: 0.76 mg/dL (ref 0.76–1.27)
Globulin, Total: 2.8 g/dL (ref 1.5–4.5)
Glucose: 97 mg/dL (ref 70–99)
Potassium: 5.2 mmol/L (ref 3.5–5.2)
Sodium: 139 mmol/L (ref 134–144)
Total Protein: 7.7 g/dL (ref 6.0–8.5)
eGFR: 114 mL/min/{1.73_m2} (ref >59)

## 2021-12-03 LAB — LEVETIRACETAM LEVEL: Levetiracetam Lvl: 15.7 ug/mL (ref 10.0–40.0)

## 2021-12-05 ENCOUNTER — Other Ambulatory Visit: Payer: Self-pay | Admitting: Nurse Practitioner

## 2021-12-05 DIAGNOSIS — R7401 Elevation of levels of liver transaminase levels: Secondary | ICD-10-CM

## 2022-02-20 ENCOUNTER — Other Ambulatory Visit: Payer: Self-pay

## 2022-04-27 ENCOUNTER — Other Ambulatory Visit: Payer: Self-pay

## 2022-06-25 ENCOUNTER — Other Ambulatory Visit: Payer: Self-pay

## 2022-08-26 ENCOUNTER — Other Ambulatory Visit: Payer: Self-pay

## 2022-09-01 ENCOUNTER — Other Ambulatory Visit: Payer: Self-pay

## 2022-10-01 ENCOUNTER — Other Ambulatory Visit: Payer: Self-pay

## 2022-10-27 ENCOUNTER — Other Ambulatory Visit: Payer: Self-pay

## 2022-10-27 ENCOUNTER — Other Ambulatory Visit: Payer: Self-pay | Admitting: Nurse Practitioner

## 2022-10-27 DIAGNOSIS — I10 Essential (primary) hypertension: Secondary | ICD-10-CM

## 2022-10-28 ENCOUNTER — Other Ambulatory Visit: Payer: Self-pay

## 2022-10-28 MED ORDER — AMLODIPINE BESYLATE 10 MG PO TABS
10.0000 mg | ORAL_TABLET | Freq: Every day | ORAL | 0 refills | Status: DC
  Filled 2022-10-28: qty 30, 30d supply, fill #0

## 2022-12-02 ENCOUNTER — Other Ambulatory Visit: Payer: Self-pay

## 2022-12-02 ENCOUNTER — Other Ambulatory Visit: Payer: Self-pay | Admitting: Family Medicine

## 2022-12-02 DIAGNOSIS — I10 Essential (primary) hypertension: Secondary | ICD-10-CM

## 2022-12-08 ENCOUNTER — Other Ambulatory Visit: Payer: Self-pay | Admitting: Nurse Practitioner

## 2022-12-08 DIAGNOSIS — I10 Essential (primary) hypertension: Secondary | ICD-10-CM

## 2022-12-08 NOTE — Telephone Encounter (Signed)
Medication Refill - Medication: amLODipine (NORVASC) 10 MG tablet ,  Pt asked if it was possible to get a courtesy refill to get him through until his appointment. Please advise.    Has the patient contacted their pharmacy? Yes.    (Agent: If yes, when and what did the pharmacy advise?)  Preferred Pharmacy (with phone number or street name):  [facility]  [address] [place] [address]  Phone: [phone] Fax: [phone]  Hours: M-F 7:30a-6:00p   Has the patient been seen for an appointment in the last year OR does the patient have an upcoming appointment? Yes.    Agent: Please be advised that RX refills may take up to 3 business days. We ask that you follow-up with your pharmacy.

## 2022-12-09 ENCOUNTER — Other Ambulatory Visit: Payer: Self-pay

## 2022-12-09 MED ORDER — AMLODIPINE BESYLATE 10 MG PO TABS
10.0000 mg | ORAL_TABLET | Freq: Every day | ORAL | 0 refills | Status: DC
  Filled 2022-12-09: qty 30, 30d supply, fill #0

## 2022-12-09 NOTE — Telephone Encounter (Signed)
Pt. Has upcoming appointment. Requested Prescriptions  Pending Prescriptions Disp Refills   amLODipine (NORVASC) 10 MG tablet 30 tablet 0    Sig: Take 1 tablet (10 mg total) by mouth daily. Please make PCP appointment.     Cardiovascular: Calcium Channel Blockers 2 Failed - 12/08/2022 10:34 AM      Failed - Valid encounter within last 6 months    Recent Outpatient Visits           1 year ago Essential hypertension   [place] [facility] [place], Shea Stakes, NP   1 year ago Essential hypertension   [place] [facility] [employer], Shea Stakes, NP   1 year ago Essential hypertension   [facility] Claiborne Rigg, NP   1 year ago Essential hypertension   [place] [facility] [place], Shea Stakes, NP   2 years ago Essential hypertension   [place] Chalet, RPH-CPP       Future Appointments             In 2 months Claiborne Rigg, NP [facility]            Passed - Last BP in normal range    BP Readings from Last 1 Encounters:  12/01/21 127/86         Passed - Last Heart Rate in normal range    Pulse Readings from Last 1 Encounters:  12/01/21 96

## 2022-12-10 ENCOUNTER — Other Ambulatory Visit: Payer: Self-pay

## 2022-12-10 ENCOUNTER — Other Ambulatory Visit: Payer: Self-pay | Admitting: Nurse Practitioner

## 2022-12-10 DIAGNOSIS — G40409 Other generalized epilepsy and epileptic syndromes, not intractable, without status epilepticus: Secondary | ICD-10-CM

## 2022-12-10 MED ORDER — LEVETIRACETAM 500 MG PO TABS
500.0000 mg | ORAL_TABLET | Freq: Two times a day (BID) | ORAL | 0 refills | Status: DC
  Filled 2022-12-10: qty 180, 90d supply, fill #0

## 2022-12-10 NOTE — Telephone Encounter (Signed)
Requested medication (s) are due for refill today: routing for review  Requested medication (s) are on the active medication list: no  Last refill:  10/14/21  Future visit scheduled: yes  Notes to clinic:  Unable to refill per protocol, Rx expired. Prescription expired routing for approval. Patient has future OV.      Requested Prescriptions  Pending Prescriptions Disp Refills   levETIRAcetam (KEPPRA) 500 MG tablet 180 tablet 1    Sig: Take 1 tablet (500 mg total) by mouth 2 (two) times daily.     Neurology:  Anticonvulsants - levetiracetam Failed - 12/10/2022 10:38 AM      Failed - Cr in normal range and within 360 days    Creatinine, Ser  Date Value Ref Range Status  12/01/2021 0.76 0.76 - 1.27 mg/dL Final         Failed - Completed PHQ-2 or PHQ-9 in the last 360 days      Failed - Valid encounter within last 12 months    Recent Outpatient Visits           1 year ago Essential hypertension   [place] [facility], Shea Stakes, NP   1 year ago Essential hypertension   [place] [facility] [place], Shea Stakes, NP   1 year ago Essential hypertension   [place] [facility] [place], Shea Stakes, NP   1 year ago Essential hypertension   [place] [facility] [place], Shea Stakes, NP   2 years ago Essential hypertension    [place] [facility] Dru[facility] Chalet, RPH-CPP       Future Appointments             In 2 months Claiborne Rigg, NP [facility]

## 2022-12-11 ENCOUNTER — Other Ambulatory Visit: Payer: Self-pay

## 2023-02-17 ENCOUNTER — Other Ambulatory Visit: Payer: Self-pay

## 2023-02-17 ENCOUNTER — Encounter: Payer: Self-pay | Admitting: Nurse Practitioner

## 2023-02-17 ENCOUNTER — Ambulatory Visit: Payer: Medicaid Other | Attending: Nurse Practitioner | Admitting: Nurse Practitioner

## 2023-02-17 VITALS — BP 117/75 | HR 72 | Ht 67.0 in | Wt 126.2 lb

## 2023-02-17 DIAGNOSIS — I10 Essential (primary) hypertension: Secondary | ICD-10-CM | POA: Diagnosis present

## 2023-02-17 DIAGNOSIS — Z1211 Encounter for screening for malignant neoplasm of colon: Secondary | ICD-10-CM | POA: Diagnosis not present

## 2023-02-17 DIAGNOSIS — G40409 Other generalized epilepsy and epileptic syndromes, not intractable, without status epilepticus: Secondary | ICD-10-CM | POA: Insufficient documentation

## 2023-02-17 DIAGNOSIS — E7800 Pure hypercholesterolemia, unspecified: Secondary | ICD-10-CM | POA: Diagnosis not present

## 2023-02-17 DIAGNOSIS — K089 Disorder of teeth and supporting structures, unspecified: Secondary | ICD-10-CM | POA: Diagnosis not present

## 2023-02-17 DIAGNOSIS — D649 Anemia, unspecified: Secondary | ICD-10-CM | POA: Diagnosis not present

## 2023-02-17 DIAGNOSIS — Z79899 Other long term (current) drug therapy: Secondary | ICD-10-CM | POA: Insufficient documentation

## 2023-02-17 MED ORDER — LEVETIRACETAM 500 MG PO TABS
500.0000 mg | ORAL_TABLET | Freq: Two times a day (BID) | ORAL | 1 refills | Status: DC
Start: 2023-02-17 — End: 2023-08-20
  Filled 2023-02-17 – 2023-06-14 (×2): qty 180, 90d supply, fill #0

## 2023-02-17 MED ORDER — AMLODIPINE BESYLATE 10 MG PO TABS
10.0000 mg | ORAL_TABLET | Freq: Every day | ORAL | 1 refills | Status: DC
Start: 2023-02-17 — End: 2023-08-20
  Filled 2023-02-17: qty 90, 90d supply, fill #0
  Filled 2023-06-14: qty 90, 90d supply, fill #1

## 2023-02-17 MED ORDER — IBUPROFEN 600 MG PO TABS
600.0000 mg | ORAL_TABLET | Freq: Three times a day (TID) | ORAL | 1 refills | Status: DC | PRN
  Filled 2023-02-17: qty 60, 20d supply, fill #0
  Filled 2023-04-30: qty 60, 20d supply, fill #1

## 2023-02-17 NOTE — Progress Notes (Signed)
Assessment & Plan:  [place] was seen today for medication refill.  Diagnoses and all orders for this visit:  Essential hypertension -     CMP14+EGFR -     amLODipine (NORVASC) 10 MG tablet; Take 1 tablet (10 mg total) by mouth daily.  Continue all antihypertensives as prescribed.  Reminded to bring in blood pressure log for follow  up appointment.  RECOMMENDATIONS: DASH/Mediterranean Diets are healthier choices for HTN.    Colon cancer screening -     Ambulatory referral to Gastroenterology  Hypercholesterolemia -     Lipid panel INSTRUCTIONS: Work on a low fat, heart healthy diet and participate in regular aerobic exercise program by working out at least 150 minutes per week; 5 days a week-30 minutes per day. Avoid red meat/beef/steak,  fried foods. junk foods, sodas, sugary drinks, unhealthy snacking, alcohol and smoking.  Drink at least 80 oz of water per day and monitor your carbohydrate intake daily.    Anemia, unspecified type -     CBC with Differential  Poor dentition He is currently being followed by his dentist. -     ibuprofen (ADVIL) 600 MG tablet; Take 1 tablet (600 mg total) by mouth every 8 (eight) hours as needed.  Tonic clonic seizures (HCC) Denies any seizure activity.  -     Levetiracetam level -     levETIRAcetam (KEPPRA) 500 MG tablet; Take 1 tablet (500 mg total) by mouth 2 (two) times daily.     Patient has been counseled on age-appropriate routine health concerns for screening and prevention. These are reviewed and up-to-date. Referrals have been placed accordingly. Immunizations are up-to-date or declined.    Subjective:   Chief Complaint  Patient presents with   Hypertension   HPI Michael Hayden 47 y.o. male presents to office today for follow up to HTN  He has a past medical history of H/O ETOH abuse, Hypertension, and Seizures   He denies any seizure activity since 2020.   HTN Blood pressure is well controlled. He is taking amlodipine  10 mg daily as prescribed.  BP Readings from Last 3 Encounters:  02/17/23 117/75  12/01/21 127/86  04/15/21 114/74     Review of Systems  Constitutional:  Negative for fever, malaise/fatigue and weight loss.  HENT: Negative.  Negative for nosebleeds.   Eyes: Negative.  Negative for blurred vision, double vision and photophobia.  Respiratory: Negative.  Negative for cough and shortness of breath.   Cardiovascular: Negative.  Negative for chest pain, palpitations and leg swelling.  Gastrointestinal: Negative.  Negative for heartburn, nausea and vomiting.  Musculoskeletal: Negative.  Negative for myalgias.  Neurological: Negative.  Negative for dizziness, focal weakness, seizures and headaches.  Psychiatric/Behavioral: Negative.  Negative for suicidal ideas.     Past Medical History:  Diagnosis Date   H/O ETOH abuse    Hypertension    Seizures (HCC)    doesn't know age of onset, pt reports worse this year    Past Surgical History:  Procedure Laterality Date   WOUND EXPLORATION Right 06/22/2017   Procedure: WOUND EXPLORATION RIGHT THIGH;  Surgeon: Chuck Hint, MD;  [facility];  Service: Vascular;  Laterality: Right;    History reviewed. No pertinent family history.  Social History Reviewed with no changes to be made today.   Outpatient Medications Prior to Visit  Medication Sig Dispense Refill   levETIRAcetam (KEPPRA) 500 MG tablet Take 1 tablet (500 mg total) by mouth 2 (two) times daily. 180  tablet 0   amLODipine (NORVASC) 10 MG tablet Take 1 tablet (10 mg total) by mouth daily. Please make PCP appointment. (Patient not taking: Reported on 02/17/2023) 30 tablet 0   No facility-administered medications prior to visit.    No Known Allergies     Objective:    BP 117/75 (BP Location: Left Arm, Patient Position: Sitting, Cuff Size: Normal)   Pulse 72   Ht 5\' 7"  (1.702 m)   Wt 126 lb 3.2 oz (57.2 kg)   SpO2 99%   BMI 19.77 kg/m  Wt Readings from Last 3  Encounters:  02/17/23 126 lb 3.2 oz (57.2 kg)  12/01/21 131 lb 6.4 oz (59.6 kg)  04/15/21 135 lb 4 oz (61.3 kg)    Physical Exam Vitals and nursing note reviewed.  Constitutional:      Appearance: He is well-developed.  HENT:     Head: Normocephalic and atraumatic.  Cardiovascular:     Rate and Rhythm: Normal rate and regular rhythm.     Heart sounds: Normal heart sounds. No murmur heard.    No friction rub. No gallop.  Pulmonary:     Effort: Pulmonary effort is normal. No tachypnea or respiratory distress.     Breath sounds: Normal breath sounds. No decreased breath sounds, wheezing, rhonchi or rales.  Chest:     Chest wall: No tenderness.  Abdominal:     General: Bowel sounds are normal.     Palpations: Abdomen is soft.  Musculoskeletal:        General: Normal range of motion.     Cervical back: Normal range of motion.  Skin:    General: Skin is warm and dry.  Neurological:     Mental Status: He is alert and oriented to person, place, and time.     Coordination: Coordination normal.  Psychiatric:        Behavior: Behavior normal. Behavior is cooperative.        Thought Content: Thought content normal.        Judgment: Judgment normal.          Patient has been counseled extensively about nutrition and exercise as well as the importance of adherence with medications and regular follow-up. The patient was given clear instructions to go to ER or return to medical center if symptoms don't improve, worsen or new problems develop. The patient verbalized understanding.   Follow-up: Return in about 6 months (around 08/20/2023).   Claiborne Rigg, FNP-BC [facility] [place], [place] [phone]   02/17/2023, 8:43 PM

## 2023-02-17 NOTE — Patient Instructions (Addendum)
FAX [phone]

## 2023-02-18 LAB — CMP14+EGFR
ALT: 11 IU/L (ref 0–44)
AST: 14 IU/L (ref 0–40)
Albumin: 4.4 g/dL (ref 4.1–5.1)
Alkaline Phosphatase: 77 IU/L (ref 44–121)
BUN/Creatinine Ratio: 9 (ref 9–20)
BUN: 7 mg/dL (ref 6–24)
Bilirubin Total: 0.3 mg/dL (ref 0.0–1.2)
CO2: 24 mmol/L (ref 20–29)
Calcium: 9.7 mg/dL (ref 8.7–10.2)
Chloride: 101 mmol/L (ref 96–106)
Creatinine, Ser: 0.81 mg/dL (ref 0.76–1.27)
Globulin, Total: 2.9 g/dL (ref 1.5–4.5)
Glucose: 88 mg/dL (ref 70–99)
Potassium: 5.3 mmol/L — ABNORMAL HIGH (ref 3.5–5.2)
Sodium: 138 mmol/L (ref 134–144)
Total Protein: 7.3 g/dL (ref 6.0–8.5)
eGFR: 111 mL/min/{1.73_m2} (ref >59)

## 2023-02-18 LAB — LEVETIRACETAM LEVEL: Levetiracetam Lvl: 14.8 ug/mL (ref 10.0–40.0)

## 2023-02-18 LAB — CBC WITH DIFFERENTIAL/PLATELET
Basophils Absolute: 0.1 10*3/uL (ref 0.0–0.2)
Basos: 2 %
EOS (ABSOLUTE): 0.1 10*3/uL (ref 0.0–0.4)
Eos: 3 %
Hematocrit: 39.1 % (ref 37.5–51.0)
Hemoglobin: 12.7 g/dL — ABNORMAL LOW (ref 13.0–17.7)
Immature Grans (Abs): 0 10*3/uL (ref 0.0–0.1)
Immature Granulocytes: 0 %
Lymphocytes Absolute: 2 10*3/uL (ref 0.7–3.1)
Lymphs: 43 %
MCH: 29.3 pg (ref 26.6–33.0)
MCHC: 32.5 g/dL (ref 31.5–35.7)
MCV: 90 fL (ref 79–97)
Monocytes Absolute: 0.5 10*3/uL (ref 0.1–0.9)
Monocytes: 11 %
Neutrophils Absolute: 1.9 10*3/uL (ref 1.4–7.0)
Neutrophils: 41 %
Platelets: 289 10*3/uL (ref 150–450)
RBC: 4.33 x10E6/uL (ref 4.14–5.80)
RDW: 12.8 % (ref 11.6–15.4)
WBC: 4.7 10*3/uL (ref 3.4–10.8)

## 2023-02-18 LAB — LIPID PANEL
Chol/HDL Ratio: 1.5 ratio (ref 0.0–5.0)
Cholesterol, Total: 88 mg/dL — ABNORMAL LOW (ref 100–199)
HDL: 57 mg/dL (ref >39)
LDL Chol Calc (NIH): 19 mg/dL (ref 0–99)
Triglycerides: 45 mg/dL (ref 0–149)
VLDL Cholesterol Cal: 12 mg/dL (ref 5–40)

## 2023-04-30 ENCOUNTER — Other Ambulatory Visit: Payer: Self-pay

## 2023-06-14 ENCOUNTER — Other Ambulatory Visit: Payer: Self-pay

## 2023-06-15 ENCOUNTER — Other Ambulatory Visit: Payer: Self-pay

## 2023-08-20 ENCOUNTER — Other Ambulatory Visit: Payer: Self-pay

## 2023-08-20 ENCOUNTER — Ambulatory Visit: Payer: Medicaid Other | Attending: Nurse Practitioner | Admitting: Nurse Practitioner

## 2023-08-20 ENCOUNTER — Encounter: Payer: Self-pay | Admitting: Nurse Practitioner

## 2023-08-20 VITALS — BP 100/66 | HR 77 | Resp 20 | Ht 67.0 in | Wt 131.8 lb

## 2023-08-20 DIAGNOSIS — I10 Essential (primary) hypertension: Secondary | ICD-10-CM | POA: Diagnosis not present

## 2023-08-20 DIAGNOSIS — R229 Localized swelling, mass and lump, unspecified: Secondary | ICD-10-CM

## 2023-08-20 DIAGNOSIS — D649 Anemia, unspecified: Secondary | ICD-10-CM

## 2023-08-20 DIAGNOSIS — G40409 Other generalized epilepsy and epileptic syndromes, not intractable, without status epilepticus: Secondary | ICD-10-CM

## 2023-08-20 DIAGNOSIS — Z139 Encounter for screening, unspecified: Secondary | ICD-10-CM

## 2023-08-20 MED ORDER — LEVETIRACETAM 500 MG PO TABS
500.0000 mg | ORAL_TABLET | Freq: Two times a day (BID) | ORAL | 1 refills | Status: DC
Start: 2023-08-20 — End: 2024-06-14
  Filled 2023-08-20 – 2024-02-08 (×2): qty 180, 90d supply, fill #0

## 2023-08-20 MED ORDER — AMLODIPINE BESYLATE 5 MG PO TABS
5.0000 mg | ORAL_TABLET | Freq: Every day | ORAL | 1 refills | Status: DC
Start: 2023-08-20 — End: 2024-06-14
  Filled 2023-08-20 – 2023-11-09 (×3): qty 90, 90d supply, fill #0
  Filled 2024-02-08: qty 90, 90d supply, fill #1

## 2023-08-20 NOTE — Progress Notes (Signed)
 Assessment & Plan:  Michael Hayden was seen today for medical management of chronic issues.  Diagnoses and all orders for this visit:  Essential hypertension DECREASE AMLODIPINE  DOSE -     CMP14+EGFR -     amLODipine  (NORVASC ) 5 MG tablet; Take 1 tablet (5 mg total) by mouth daily. DOSE CHANGE  Anemia, unspecified type -     CBC with Differential  Tonic clonic seizures (HCC) -     levETIRAcetam  (KEPPRA ) 500 MG tablet; Take 1 tablet (500 mg total) by mouth 2 (two) times daily.  Encounter for screening involving social determinants of health (SDoH) -     AMB Referral VBCI Care Management  Skin mass -     Ambulatory referral to Dermatology    Patient has been counseled on age-appropriate routine health concerns for screening and prevention. These are reviewed and up-to-date. Referrals have been placed accordingly. Immunizations are up-to-date or declined.    Subjective:   Chief Complaint  Patient presents with   Medical Management of Chronic Issues    Michael Hayden 48 y.o. male presents to office today for follow up to HTN  He has a history of ETOH abuse, HTN and seizures (last seizure several years ago) Stopped smoking and drinking last year!!  HTN Blood pressure is low normal today. He is currently prescribed amlodipine  10 mg daily and taking as prescribed.  BP Readings from Last 3 Encounters:  08/20/23 100/66  02/17/23 117/75  12/01/21 127/86     Skin Lesion Present for 5 years. He was initially referred to Dermatology in 2019 but has been lost to follow up for this. There is a larger hyperpigmented mass attached to the skin near the right inguinal area. The mass has irregular borders and is nontender to touch. Approximately 2 inches in diameter.    Review of Systems  Constitutional:  Negative for fever, malaise/fatigue and weight loss.  HENT: Negative.  Negative for nosebleeds.   Eyes: Negative.  Negative for blurred vision, double vision and photophobia.   Respiratory: Negative.  Negative for cough and shortness of breath.   Cardiovascular: Negative.  Negative for chest pain, palpitations and leg swelling.  Gastrointestinal: Negative.  Negative for heartburn, nausea and vomiting.  Musculoskeletal: Negative.  Negative for myalgias.  Neurological: Negative.  Negative for dizziness, focal weakness, seizures and headaches.  Psychiatric/Behavioral: Negative.  Negative for suicidal ideas.     Past Medical History:  Diagnosis Date   H/O ETOH abuse    Hypertension    Seizures (HCC)    doesn't know age of onset, pt reports worse this year    Past Surgical History:  Procedure Laterality Date   WOUND EXPLORATION Right 06/22/2017   Procedure: WOUND EXPLORATION RIGHT THIGH;  Surgeon: Michael Lonni RAMAN, MD;  Location: [facility] OR;  Service: Vascular;  Laterality: Right;    History reviewed. No pertinent family history.  Social History Reviewed with no changes to be made today.   Outpatient Medications Prior to Visit  Medication Sig Dispense Refill   ibuprofen  (ADVIL ) 600 MG tablet Take 1 tablet (600 mg total) by mouth every 8 (eight) hours as needed. 60 tablet 1   amLODipine  (NORVASC ) 10 MG tablet Take 1 tablet (10 mg total) by mouth daily. Please make PCP appointment. 90 tablet 1   levETIRAcetam  (KEPPRA ) 500 MG tablet Take 1 tablet (500 mg total) by mouth 2 (two) times daily. 180 tablet 1   No facility-administered medications prior to visit.    No Known Allergies  Objective:    BP 100/66 (BP Location: Left Arm, Patient Position: Sitting, Cuff Size: Normal)   Pulse 77   Resp 20   Ht 5' 7 (1.702 m)   Wt 131 lb 12.8 oz (59.8 kg)   SpO2 100%   BMI 20.64 kg/m  Wt Readings from Last 3 Encounters:  08/20/23 131 lb 12.8 oz (59.8 kg)  02/17/23 126 lb 3.2 oz (57.2 kg)  12/01/21 131 lb 6.4 oz (59.6 kg)    Physical Exam Vitals and nursing note reviewed.  Constitutional:      Appearance: He is well-developed.  HENT:     Head:  Normocephalic and atraumatic.  Cardiovascular:     Rate and Rhythm: Normal rate and regular rhythm.     Heart sounds: Normal heart sounds. No murmur heard.    No friction rub. No gallop.  Pulmonary:     Effort: Pulmonary effort is normal. No tachypnea or respiratory distress.     Breath sounds: Normal breath sounds. No decreased breath sounds, wheezing, rhonchi or rales.  Chest:     Chest wall: No tenderness.  Abdominal:     General: Bowel sounds are normal.     Palpations: Abdomen is soft.  Musculoskeletal:        General: Normal range of motion.     Cervical back: Normal range of motion.  Skin:    General: Skin is warm and dry.  Neurological:     Mental Status: He is alert and oriented to person, place, and time.     Coordination: Coordination normal.  Psychiatric:        Behavior: Behavior normal. Behavior is cooperative.        Thought Content: Thought content normal.        Judgment: Judgment normal.          Patient has been counseled extensively about nutrition and exercise as well as the importance of adherence with medications and regular follow-up. The patient was given clear instructions to go to ER or return to medical center if symptoms don't improve, worsen or new problems develop. The patient verbalized understanding.   Follow-up: Return in about 3 months (around 11/18/2023).   Haze LELON Servant, FNP-BC [facility] [place], [place] [phone]   08/20/2023, 10:20 AM

## 2023-08-20 NOTE — Patient Instructions (Signed)
 Placed in Michael Hayden, [place] [address] PH# [phone]

## 2023-08-21 LAB — CBC WITH DIFFERENTIAL/PLATELET
Basophils Absolute: 0.1 10*3/uL (ref 0.0–0.2)
Basos: 1 %
EOS (ABSOLUTE): 0.2 10*3/uL (ref 0.0–0.4)
Eos: 4 %
Hematocrit: 40.3 % (ref 37.5–51.0)
Hemoglobin: 12.9 g/dL — ABNORMAL LOW (ref 13.0–17.7)
Immature Grans (Abs): 0 10*3/uL (ref 0.0–0.1)
Immature Granulocytes: 0 %
Lymphocytes Absolute: 2 10*3/uL (ref 0.7–3.1)
Lymphs: 36 %
MCH: 29.2 pg (ref 26.6–33.0)
MCHC: 32 g/dL (ref 31.5–35.7)
MCV: 91 fL (ref 79–97)
Monocytes Absolute: 0.6 10*3/uL (ref 0.1–0.9)
Monocytes: 11 %
Neutrophils Absolute: 2.7 10*3/uL (ref 1.4–7.0)
Neutrophils: 48 %
Platelets: 332 10*3/uL (ref 150–450)
RBC: 4.42 x10E6/uL (ref 4.14–5.80)
RDW: 12.9 % (ref 11.6–15.4)
WBC: 5.6 10*3/uL (ref 3.4–10.8)

## 2023-08-21 LAB — CMP14+EGFR
ALT: 11 [IU]/L (ref 0–44)
AST: 16 [IU]/L (ref 0–40)
Albumin: 4.2 g/dL (ref 4.1–5.1)
Alkaline Phosphatase: 91 [IU]/L (ref 44–121)
BUN/Creatinine Ratio: 10 (ref 9–20)
BUN: 6 mg/dL (ref 6–24)
Bilirubin Total: <0.2 mg/dL (ref 0.0–1.2)
CO2: 27 mmol/L (ref 20–29)
Calcium: 9.2 mg/dL (ref 8.7–10.2)
Chloride: 103 mmol/L (ref 96–106)
Creatinine, Ser: 0.62 mg/dL — ABNORMAL LOW (ref 0.76–1.27)
Globulin, Total: 2.8 g/dL (ref 1.5–4.5)
Glucose: 83 mg/dL (ref 70–99)
Potassium: 4.7 mmol/L (ref 3.5–5.2)
Sodium: 141 mmol/L (ref 134–144)
Total Protein: 7 g/dL (ref 6.0–8.5)
eGFR: 119 mL/min/{1.73_m2} (ref >59)

## 2023-08-31 ENCOUNTER — Encounter: Payer: Self-pay | Admitting: Nurse Practitioner

## 2023-09-01 ENCOUNTER — Other Ambulatory Visit: Payer: Self-pay

## 2023-09-03 ENCOUNTER — Telehealth: Payer: Self-pay

## 2023-09-03 NOTE — Telephone Encounter (Signed)
Copied from CRM [id number]. Topic: Clinical - Lab/Test Results >> Sep 03, 2023 10:42 AM Michael Hayden wrote: Reason for CRM: Patient has questions about lab results, please call [phone]    CMA has responded to patient via mychart.

## 2023-09-10 ENCOUNTER — Other Ambulatory Visit: Payer: Self-pay
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
## 2023-09-10 NOTE — Patient Instructions (Signed)
Visit Information  Mr. Michael Hayden was given information about Medicaid Managed Care team care coordination services as a part of their Healthy [facility] Medicaid benefit. Michael Hayden verbally consented to engagement with the [facility] Managed Care team.   If you are experiencing a medical emergency, please call 911 or report to your local emergency department or urgent care.   If you have a non-emergency medical problem during routine business hours, please contact your provider's office and ask to speak with a nurse.   For questions related to your Healthy [facility] health plan, please call: [phone] or visit the homepage here: [email or web address]  If you would like to schedule transportation through your Healthy [facility] plan, please call the following number at least 2 days in advance of your appointment: [phone]  For information about your ride after you set it up, call Ride Assist at [phone]. Use this number to activate a Will Call pickup, or if your transportation is late for a scheduled pickup. Use this number, too, if you need to make a change or cancel a previously scheduled reservation.  If you need transportation services right away, call [phone]. The after-hours call center is staffed 24 hours to handle ride assistance and urgent reservation requests (including discharges) 365 days a year. Urgent trips include sick visits, hospital discharge requests and life-sustaining treatment.  Call the [facility] Line at [phone], at any time, 24 hours a day, 7 days a week. If you are in danger or need immediate medical attention call 911.  If you would like help to quit smoking, call 1-800-QUIT-NOW ((360)203-5302) OR Espaol: 1-855-Djelo-Ya (4-742-595-6387) o para ms informacin haga clic aqu or Text READY to 564-332 to register via text  Michael Hayden - following are the goals we discussed in your visit today:   Goals  Addressed   None      Social Worker will follow up in 30 days.   Michael Hayden, Michael Hayden, MHA [facility]  Managed Medicaid Social Worker [phone]   Following is a copy of your plan of care:  There are no care plans that you recently modified to display for this patient.

## 2023-10-12 ENCOUNTER — Other Ambulatory Visit: Payer: Self-pay

## 2023-10-12 NOTE — Patient Instructions (Signed)
 Visit Information  Mr. Michael Hayden was given information about Medicaid Managed Care team care coordination services as a part of their Healthy [facility] Medicaid benefit. Michael Hayden verbally consented to engagement with the [facility] Managed Care team.   If you are experiencing a medical emergency, please call 911 or report to your local emergency department or urgent care.   If you have a non-emergency medical problem during routine business hours, please contact your provider's office and ask to speak with a nurse.   For questions related to your Healthy [facility] health plan, please call: [phone] or visit the homepage here: [email or web address]  If you would like to schedule transportation through your Healthy [facility] plan, please call the following number at least 2 days in advance of your appointment: [phone]  For information about your ride after you set it up, call Ride Assist at [phone]. Use this number to activate a Will Call pickup, or if your transportation is late for a scheduled pickup. Use this number, too, if you need to make a change or cancel a previously scheduled reservation.  If you need transportation services right away, call [phone]. The after-hours call center is staffed 24 hours to handle ride assistance and urgent reservation requests (including discharges) 365 days a year. Urgent trips include sick visits, hospital discharge requests and life-sustaining treatment.  Call the [facility] Line at [phone], at any time, 24 hours a day, 7 days a week. If you are in danger or need immediate medical attention call 911.  If you would like help to quit smoking, call 1-800-QUIT-NOW ((934)526-9647) OR Espaol: 1-855-Djelo-Ya (8-416-606-3016) o para ms informacin haga clic aqu or Text READY to 010-932 to register via text  Mr. Capuano - following are the goals we discussed in your visit today:   Goals  Addressed   None    A HIPAA compliant phone message was left for the patient providing contact information and requesting a return call.   Michael Hayden, Michael Hayden, MHA [facility]  Managed Medicaid Social Worker [phone]   Following is a copy of your plan of care:  There are no care plans that you recently modified to display for this patient.

## 2023-10-12 NOTE — Patient Outreach (Signed)
  Medicaid Managed Care   Unsuccessful Outreach Note  10/12/2023 Name: Michael Hayden MRN: [id number] DOB: [birth year]  Referred by: Claiborne Rigg, NP Reason for referral : High Risk Managed Medicaid (MM Social work unsuccessful telephone outreach )   An unsuccessful telephone outreach was attempted today. The patient was referred to the case management team for assistance with care management and care coordination.   Follow Up Plan: A HIPAA compliant phone message was left for the patient providing contact information and requesting a return call.   Abelino Derrick, MHA [facility]  Managed [facility] Social Worker [phone]

## 2023-10-25 ENCOUNTER — Telehealth: Payer: Self-pay

## 2023-10-25 NOTE — Progress Notes (Signed)
..   Medicaid Managed Care   Unsuccessful Outreach Note  10/25/2023 Name: Michael Hayden MRN: [id number] DOB: [birth year]  Referred by: Claiborne Rigg, NP Reason for referral : High Risk Managed Medicaid (Called the patient today to get his missed phone visit with the BSW rescheduled. I had to leave a  message on his VM.)   A second unsuccessful telephone outreach was attempted today. The patient was referred to the case management team for assistance with care management and care coordination.   Follow Up Plan: A HIPAA compliant phone message was left for the patient providing contact information and requesting a return call.  The care management team will reach out to the patient again over the next 2 days.   Weston Settle [facility], [facility] Guide Direct Dial: [phone]  Fax: [phone]

## 2023-11-09 ENCOUNTER — Other Ambulatory Visit: Payer: Self-pay

## 2023-11-10 ENCOUNTER — Other Ambulatory Visit: Payer: Self-pay

## 2023-11-12 ENCOUNTER — Other Ambulatory Visit: Payer: Self-pay

## 2023-11-12 NOTE — Patient Instructions (Signed)
 Visit Information  Michael Hayden was given information about Medicaid Managed Care team care coordination services as a part of their Healthy [facility] Medicaid benefit. Michael Hayden verbally consented to engagement with the [facility] Managed Care team.   If you are experiencing a medical emergency, please call 911 or report to your local emergency department or urgent care.   If you have a non-emergency medical problem during routine business hours, please contact your provider's office and ask to speak with a nurse.   For questions related to your Healthy [facility] health plan, please call: [phone] or visit the homepage here: [email or web address]  If you would like to schedule transportation through your Healthy [facility] plan, please call the following number at least 2 days in advance of your appointment: [phone]  For information about your ride after you set it up, call Ride Assist at [phone]. Use this number to activate a Will Call pickup, or if your transportation is late for a scheduled pickup. Use this number, too, if you need to make a change or cancel a previously scheduled reservation.  If you need transportation services right away, call [phone]. The after-hours call center is staffed 24 hours to handle ride assistance and urgent reservation requests (including discharges) 365 days a year. Urgent trips include sick visits, hospital discharge requests and life-sustaining treatment.  Call the [facility] Line at [phone], at any time, 24 hours a day, 7 days a week. If you are in danger or need immediate medical attention call 911.  If you would like help to quit smoking, call 1-800-QUIT-NOW ((613) 533-3093) OR Espaol: 1-855-Djelo-Ya (1-517-616-0737) o para ms informacin haga clic aqu or Text READY to 106-269 to register via text  Mr. Meddings - following are the goals we discussed in your visit today:   Goals  Addressed   None      The  Patient                                              has been provided with contact information for the Managed Medicaid care management team and has been advised to call with any health related questions or concerns.   Michael Hayden, Michael Hayden, MHA [facility]  Managed Medicaid Social Worker [phone]   Following is a copy of your plan of care:  There are no care plans that you recently modified to display for this patient.
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
## 2024-02-08 ENCOUNTER — Other Ambulatory Visit: Payer: Self-pay

## 2024-02-16 ENCOUNTER — Telehealth: Payer: Self-pay | Admitting: Nurse Practitioner

## 2024-02-16 NOTE — Telephone Encounter (Signed)
 Called pt to confirm appt. Pt did not answer and VM was full. I could not leave a message.

## 2024-02-18 ENCOUNTER — Ambulatory Visit: Payer: Medicaid Other | Attending: Nurse Practitioner | Admitting: Nurse Practitioner

## 2024-02-18 ENCOUNTER — Encounter: Payer: Self-pay | Admitting: Nurse Practitioner

## 2024-02-18 VITALS — BP 103/62 | HR 67 | Ht 67.0 in | Wt 130.2 lb

## 2024-02-18 DIAGNOSIS — F1920 Other psychoactive substance dependence, uncomplicated: Secondary | ICD-10-CM

## 2024-02-18 DIAGNOSIS — M25512 Pain in left shoulder: Secondary | ICD-10-CM | POA: Diagnosis not present

## 2024-02-18 DIAGNOSIS — Z1211 Encounter for screening for malignant neoplasm of colon: Secondary | ICD-10-CM | POA: Diagnosis not present

## 2024-02-18 DIAGNOSIS — I10 Essential (primary) hypertension: Secondary | ICD-10-CM | POA: Diagnosis not present

## 2024-02-18 DIAGNOSIS — G8929 Other chronic pain: Secondary | ICD-10-CM
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
## 2024-02-18 NOTE — Patient Instructions (Signed)
 Michael Hayden [address] [place], [place] [address] ?PH# [phone] ?

## 2024-02-20 LAB — CMP14+EGFR
ALT: 9 IU/L (ref 0–44)
AST: 18 IU/L (ref 0–40)
Albumin: 4.6 g/dL (ref 4.1–5.1)
Alkaline Phosphatase: 87 IU/L (ref 44–121)
BUN/Creatinine Ratio: 12 (ref 9–20)
BUN: 10 mg/dL (ref 6–24)
Bilirubin Total: 0.3 mg/dL (ref 0.0–1.2)
CO2: 24 mmol/L (ref 20–29)
Calcium: 9.6 mg/dL (ref 8.7–10.2)
Chloride: 99 mmol/L (ref 96–106)
Creatinine, Ser: 0.84 mg/dL (ref 0.76–1.27)
Globulin, Total: 2.7 g/dL (ref 1.5–4.5)
Glucose: 84 mg/dL (ref 70–99)
Potassium: 4.7 mmol/L (ref 3.5–5.2)
Sodium: 138 mmol/L (ref 134–144)
Total Protein: 7.3 g/dL (ref 6.0–8.5)
eGFR: 109 mL/min/1.73 (ref >59)

## 2024-02-20 LAB — LEVETIRACETAM LEVEL: Levetiracetam Lvl: 13.7 ug/mL (ref 10.0–40.0)

## 2024-02-21 ENCOUNTER — Ambulatory Visit: Payer: Self-pay | Admitting: Nurse Practitioner

## 2024-05-11 ENCOUNTER — Encounter: Payer: Self-pay | Admitting: Nurse Practitioner

## 2024-06-14 ENCOUNTER — Other Ambulatory Visit: Payer: Self-pay | Admitting: Nurse Practitioner

## 2024-06-14 ENCOUNTER — Other Ambulatory Visit: Payer: Self-pay

## 2024-06-14 DIAGNOSIS — I10 Essential (primary) hypertension: Secondary | ICD-10-CM

## 2024-06-14 DIAGNOSIS — G40409 Other generalized epilepsy and epileptic syndromes, not intractable, without status epilepticus: Secondary | ICD-10-CM

## 2024-06-14 MED ORDER — AMLODIPINE BESYLATE 5 MG PO TABS
5.0000 mg | ORAL_TABLET | Freq: Every day | ORAL | 1 refills | Status: DC
  Filled 2024-06-14: qty 90, 90d supply, fill #0

## 2024-06-14 MED ORDER — LEVETIRACETAM 500 MG PO TABS
500.0000 mg | ORAL_TABLET | Freq: Two times a day (BID) | ORAL | 1 refills | Status: DC
  Filled 2024-06-14: qty 180, 90d supply, fill #0

## 2024-06-16 ENCOUNTER — Other Ambulatory Visit: Payer: Self-pay

## 2024-08-18 ENCOUNTER — Telehealth: Payer: Self-pay | Admitting: Nurse Practitioner

## 2024-08-18 NOTE — Telephone Encounter (Signed)
 Pt confirmed appt

## 2024-08-21 ENCOUNTER — Ambulatory Visit: Attending: Nurse Practitioner | Admitting: Nurse Practitioner

## 2024-08-21 ENCOUNTER — Encounter: Payer: Self-pay | Admitting: Nurse Practitioner

## 2024-08-21 ENCOUNTER — Other Ambulatory Visit: Payer: Self-pay

## 2024-08-21 VITALS — BP 102/62 | HR 79 | Ht 67.0 in | Wt 132.2 lb

## 2024-08-21 DIAGNOSIS — I10 Essential (primary) hypertension: Secondary | ICD-10-CM

## 2024-08-21 DIAGNOSIS — G40409 Other generalized epilepsy and epileptic syndromes, not intractable, without status epilepticus: Secondary | ICD-10-CM | POA: Diagnosis not present

## 2024-08-21 DIAGNOSIS — Z0000 Encounter for general adult medical examination without abnormal findings: Secondary | ICD-10-CM

## 2024-08-21 DIAGNOSIS — R7989 Other specified abnormal findings of blood chemistry: Secondary | ICD-10-CM

## 2024-08-21 DIAGNOSIS — H6123 Impacted cerumen, bilateral: Secondary | ICD-10-CM

## 2024-08-21 MED ORDER — DEBROX 6.5 % OT SOLN
5.0000 [drp] | Freq: Two times a day (BID) | OTIC | 1 refills | Status: AC
  Filled 2024-08-21: qty 15, 30d supply, fill #0

## 2024-08-21 MED ORDER — LEVETIRACETAM 500 MG PO TABS
500.0000 mg | ORAL_TABLET | Freq: Two times a day (BID) | ORAL | 1 refills | Status: AC
  Filled 2024-08-21: qty 180, 90d supply, fill #0

## 2024-08-21 MED ORDER — AMLODIPINE BESYLATE 5 MG PO TABS
5.0000 mg | ORAL_TABLET | Freq: Every day | ORAL | 1 refills | Status: AC
  Filled 2024-08-21: qty 90, 90d supply, fill #0

## 2024-08-21 NOTE — Patient Instructions (Signed)
 [facility] for colonoscopy [address] [place], [place] [address] PH# [phone]
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
## 2024-08-22 ENCOUNTER — Ambulatory Visit: Payer: Self-pay | Admitting: Nurse Practitioner

## 2024-08-22 LAB — CBC WITH DIFFERENTIAL/PLATELET
Basophils Absolute: 0.1 x10E3/uL (ref 0.0–0.2)
Basos: 1 %
EOS (ABSOLUTE): 0.2 x10E3/uL (ref 0.0–0.4)
Eos: 4 %
Hematocrit: 43.4 % (ref 37.5–51.0)
Hemoglobin: 14.1 g/dL (ref 13.0–17.7)
Immature Grans (Abs): 0 x10E3/uL (ref 0.0–0.1)
Immature Granulocytes: 0 %
Lymphocytes Absolute: 2.5 x10E3/uL (ref 0.7–3.1)
Lymphs: 42 %
MCH: 30.5 pg (ref 26.6–33.0)
MCHC: 32.5 g/dL (ref 31.5–35.7)
MCV: 94 fL (ref 79–97)
Monocytes Absolute: 0.6 x10E3/uL (ref 0.1–0.9)
Monocytes: 11 %
Neutrophils Absolute: 2.5 x10E3/uL (ref 1.4–7.0)
Neutrophils: 42 %
Platelets: 301 x10E3/uL (ref 150–450)
RBC: 4.62 x10E6/uL (ref 4.14–5.80)
RDW: 13.1 % (ref 11.6–15.4)
WBC: 5.9 x10E3/uL (ref 3.4–10.8)

## 2024-08-22 LAB — LIPID PANEL
Chol/HDL Ratio: 1.5 ratio (ref 0.0–5.0)
Cholesterol, Total: 88 mg/dL — ABNORMAL LOW (ref 100–199)
HDL: 57 mg/dL
LDL Chol Calc (NIH): 14 mg/dL (ref 0–99)
Triglycerides: 87 mg/dL (ref 0–149)
VLDL Cholesterol Cal: 17 mg/dL (ref 5–40)

## 2024-08-22 LAB — CMP14+EGFR
ALT: 10 IU/L (ref 0–44)
AST: 14 IU/L (ref 0–40)
Albumin: 4.2 g/dL (ref 4.1–5.1)
Alkaline Phosphatase: 83 IU/L (ref 47–123)
BUN/Creatinine Ratio: 12 (ref 9–20)
BUN: 10 mg/dL (ref 6–24)
Bilirubin Total: <0.2 mg/dL (ref 0.0–1.2)
CO2: 26 mmol/L (ref 20–29)
Calcium: 9.6 mg/dL (ref 8.7–10.2)
Chloride: 104 mmol/L (ref 96–106)
Creatinine, Ser: 0.83 mg/dL (ref 0.76–1.27)
Globulin, Total: 2.6 g/dL (ref 1.5–4.5)
Glucose: 86 mg/dL (ref 70–99)
Potassium: 5.1 mmol/L (ref 3.5–5.2)
Sodium: 141 mmol/L (ref 134–144)
Total Protein: 6.8 g/dL (ref 6.0–8.5)
eGFR: 109 mL/min/1.73

## 2025-02-21 ENCOUNTER — Ambulatory Visit: Payer: Self-pay | Admitting: Nurse Practitioner
